# Patient Record
Sex: Male | Born: 1975
Health system: Southern US, Community
[De-identification: ages and names within clinical notes are randomized; demographics above are authoritative.]

## PROBLEM LIST (undated history)

## (undated) DIAGNOSIS — Z9114 Patient's other noncompliance with medication regimen: Secondary | ICD-10-CM

## (undated) DIAGNOSIS — G43909 Migraine, unspecified, not intractable, without status migrainosus: Secondary | ICD-10-CM

## (undated) DIAGNOSIS — R569 Unspecified convulsions: Secondary | ICD-10-CM

## (undated) DIAGNOSIS — Z91148 Patient's other noncompliance with medication regimen for other reason: Secondary | ICD-10-CM

## (undated) DIAGNOSIS — N189 Chronic kidney disease, unspecified: Secondary | ICD-10-CM

## (undated) DIAGNOSIS — I10 Essential (primary) hypertension: Secondary | ICD-10-CM

## (undated) DIAGNOSIS — E781 Pure hyperglyceridemia: Secondary | ICD-10-CM

## (undated) DIAGNOSIS — M419 Scoliosis, unspecified: Secondary | ICD-10-CM

## (undated) HISTORY — PX: KIDNEY DONATION: SHX685

## (undated) HISTORY — DX: Migraine, unspecified, not intractable, without status migrainosus: G43.909

## (undated) HISTORY — DX: Pure hyperglyceridemia: E78.1

## (undated) HISTORY — DX: Chronic kidney disease, unspecified: N18.9

## (undated) HISTORY — DX: Scoliosis, unspecified: M41.9

## (undated) HISTORY — PX: BRAIN SURGERY: SHX531

## (undated) HISTORY — PX: EYE SURGERY: SHX253

## (undated) HISTORY — PX: ORTHOPEDIC SURGERY: SHX850

---

## 2000-07-08 ENCOUNTER — Encounter: Payer: Self-pay | Admitting: Emergency Medicine

## 2000-07-08 ENCOUNTER — Emergency Department (HOSPITAL_COMMUNITY): Admission: EM | Admit: 2000-07-08 | Discharge: 2000-07-08 | Payer: Self-pay

## 2004-10-11 ENCOUNTER — Emergency Department (HOSPITAL_COMMUNITY): Admission: EM | Admit: 2004-10-11 | Discharge: 2004-10-11 | Payer: Self-pay | Admitting: Emergency Medicine

## 2004-12-17 ENCOUNTER — Emergency Department (HOSPITAL_COMMUNITY): Admission: EM | Admit: 2004-12-17 | Discharge: 2004-12-18 | Payer: Self-pay | Admitting: *Deleted

## 2005-11-21 ENCOUNTER — Emergency Department (HOSPITAL_COMMUNITY): Admission: EM | Admit: 2005-11-21 | Discharge: 2005-11-21 | Payer: Self-pay | Admitting: Emergency Medicine

## 2005-12-04 ENCOUNTER — Emergency Department (HOSPITAL_COMMUNITY): Admission: EM | Admit: 2005-12-04 | Discharge: 2005-12-04 | Payer: Self-pay | Admitting: Emergency Medicine

## 2005-12-12 ENCOUNTER — Emergency Department (HOSPITAL_COMMUNITY): Admission: EM | Admit: 2005-12-12 | Discharge: 2005-12-12 | Payer: Self-pay | Admitting: Emergency Medicine

## 2005-12-13 ENCOUNTER — Ambulatory Visit: Payer: Self-pay | Admitting: Psychiatry

## 2005-12-13 ENCOUNTER — Inpatient Hospital Stay (HOSPITAL_COMMUNITY): Admission: EM | Admit: 2005-12-13 | Discharge: 2005-12-17 | Payer: Self-pay | Admitting: Psychiatry

## 2006-01-01 ENCOUNTER — Ambulatory Visit (HOSPITAL_COMMUNITY): Payer: Self-pay | Admitting: Psychology

## 2009-09-09 ENCOUNTER — Emergency Department (HOSPITAL_COMMUNITY): Admission: EM | Admit: 2009-09-09 | Discharge: 2009-09-09 | Payer: Self-pay | Admitting: Emergency Medicine

## 2010-05-03 ENCOUNTER — Emergency Department (HOSPITAL_COMMUNITY)
Admission: EM | Admit: 2010-05-03 | Discharge: 2010-05-03 | Payer: Self-pay | Source: Home / Self Care | Admitting: Emergency Medicine

## 2010-08-15 LAB — BASIC METABOLIC PANEL
Chloride: 98 mEq/L (ref 96–112)
Creatinine, Ser: 1.34 mg/dL (ref 0.4–1.5)
GFR calc Af Amer: >60 mL/min (ref >60)
Glucose, Bld: 122 mg/dL — ABNORMAL HIGH (ref 70–99)

## 2010-08-15 LAB — CBC
HCT: 46 % (ref 39.0–52.0)
Hemoglobin: 17.2 g/dL — ABNORMAL HIGH (ref 13.0–17.0)
MCH: 35.4 pg — ABNORMAL HIGH (ref 26.0–34.0)
MCHC: 37.4 g/dL — ABNORMAL HIGH (ref 30.0–36.0)
Platelets: 204 10*3/uL (ref 150–400)
RBC: 4.86 MIL/uL (ref 4.22–5.81)

## 2010-08-15 LAB — DIFFERENTIAL
Basophils Relative: 0 % (ref 0–1)
Eosinophils Absolute: 0 10*3/uL (ref 0.0–0.7)
Eosinophils Relative: 0 % (ref 0–5)
Lymphocytes Relative: 5 % — ABNORMAL LOW (ref 12–46)
Monocytes Relative: 6 % (ref 3–12)
Neutro Abs: 19.8 10*3/uL — ABNORMAL HIGH (ref 1.7–7.7)
Neutrophils Relative %: 89 % — ABNORMAL HIGH (ref 43–77)

## 2010-08-15 LAB — PHENYTOIN LEVEL, TOTAL: Phenytoin Lvl: 2.6 ug/mL — ABNORMAL LOW (ref 10.0–20.0)

## 2010-08-23 LAB — BASIC METABOLIC PANEL
GFR calc non Af Amer: 58 mL/min — ABNORMAL LOW (ref >60)
Glucose, Bld: 128 mg/dL — ABNORMAL HIGH (ref 70–99)

## 2010-08-23 LAB — CBC
Hemoglobin: 15.6 g/dL (ref 13.0–17.0)
MCHC: 35.8 g/dL (ref 30.0–36.0)
MCV: 98 fL (ref 78.0–100.0)
RBC: 4.47 MIL/uL (ref 4.22–5.81)

## 2010-08-23 LAB — DIFFERENTIAL
Basophils Relative: 1 % (ref 0–1)
Eosinophils Absolute: 0.1 10*3/uL (ref 0.0–0.7)
Eosinophils Relative: 1 % (ref 0–5)
Lymphocytes Relative: 23 % (ref 12–46)
Monocytes Relative: 6 % (ref 3–12)
Neutro Abs: 5.1 10*3/uL (ref 1.7–7.7)
Neutrophils Relative %: 70 % (ref 43–77)

## 2010-08-23 LAB — PHENYTOIN LEVEL, TOTAL: Phenytoin Lvl: <2.5 ug/mL — ABNORMAL LOW (ref 10.0–20.0)

## 2010-09-01 ENCOUNTER — Emergency Department (HOSPITAL_COMMUNITY)
Admission: EM | Admit: 2010-09-01 | Discharge: 2010-09-01 | Disposition: A | Discharge status: Home / Self Care | Payer: Self-pay | Attending: Emergency Medicine | Admitting: Emergency Medicine

## 2010-09-01 DIAGNOSIS — R569 Unspecified convulsions: Secondary | ICD-10-CM | POA: Insufficient documentation

## 2010-09-01 DIAGNOSIS — G40909 Epilepsy, unspecified, not intractable, without status epilepticus: Secondary | ICD-10-CM | POA: Insufficient documentation

## 2010-10-20 NOTE — Discharge Summary (Signed)
Alexander Collier, Alexander Collier                 ACCOUNT NO.:  [id number]   MEDICAL RECORD NO.:  [id number]          PATIENT TYPE:  IPS   LOCATION:  0303                          FACILITY:  [facility]   PHYSICIAN:  Geoffery Lyons, M.D.      DATE OF BIRTH:  [birth year]   DATE OF ADMISSION:  12/13/2005  DATE OF DISCHARGE:  12/17/2005                                 DISCHARGE SUMMARY   CHIEF COMPLAINT AND PRESENT ILLNESS:  This was the first admission to [facility] for this 35 year old white male, married,  involuntarily committed.  Petitioned by the ED.  Overdosed on an unknown  quantity of Valium.  Presented groggy and sedated.  Grandfather found him.  Reported suicidal ideation.  Family concerned about safety.   PAST PSYCHIATRIC HISTORY:  Denies prior admission.  Drinking alcohol  regularly.  Unclear amount.   ALCOHOL/DRUG HISTORY:  As already stated, persistent use of alcohol.   MEDICAL PROBLEMS:  Seizures.   MEDICATIONS:  Dilantin 200 mg three times a day, Valium 10 mg as needed.   PHYSICAL EXAMINATION:  Performed and failed to show any acute findings.   LABORATORY DATA:  TSH 4.013.  Blood chemistry with sodium 140, potassium  3.4, glucose 90, BUN 4, creatinine 1.0.  CBC with white blood cells 8.5,  hemoglobin 17.2.   MENTAL STATUS EXAM:  Mildly sedated, slurred speech, motor slowing, slow  response.  Speech relevant, decreased production, consistent to interview.  Mood irritable and depressed.  Affect irritable.  Thought processes logical,  coherent and relevant.  No delusions.  No suicidal or homicidal ideation.  No hallucinations.  Cognition was well-preserved.   ADMISSION DIAGNOSES:  AXIS I:  Depressive disorder not otherwise specified.  Alcohol abuse; rule out dependence.  AXIS II:  No diagnosis.  AXIS III:  Seizure disorder, status post brain surgery.  AXIS IV:  Moderate.  AXIS V:  GAF upon admission 35; highest GAF in the last year 70.   HOSPITAL COURSE:  He was  admitted.  He was started in individual and group  psychotherapy.  We detoxified with Librium.  He was maintained on his  Dilantin.  He endorsed that he got intoxicated after he became very upset  after wife told him that he was going to leave him.  He reported an overdose  on his Valium and then he threatened to shoot himself with a gun.  He  denied.  [place] [employer] he had a stable job of 11 years.  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  Endorsed that he had three daughters, 16 years married, wife  cannot say why she is wanting to leave him, upset but minimizes, denies  everything that is going on, wanting to leave the hospital.  On December 14, 2005, he was more open about the situation.  Admits to being depressed.  Admits to feeling down.  Started dealing with the possible loss of the  relationship with his wife.  Mother was supportive.  Sister was supportive.  Endorsed he is close to his great-uncle and his great-uncle has told him  about the consequence of killing himself as well as what it would do to his  family.  There was some irritability but eventually he was able to settle  down.  His mood became more euthymic.  There was a session with his mother,  sister and brother.  They were supportive.  By December 16, 2005, he was turning  the corner.  His mood improved.  His affect became brighter.  On December 17, 2005, he was in full contact with reality.  There were no suicidal or  homicidal ideation.  No hallucinations.  No delusions.  Endorsed he was  feeling much better, insightful.  Recognizes he had to come to the unit.  Committed to abstain as indeed he felt that alcohol was making things worse  for him.  As he was in full contact with reality, much improved, we went  ahead and discharged to outpatient follow-up.   DISCHARGE DIAGNOSES:  AXIS I:  Depressive disorder not otherwise specified.  Alcohol abuse.  AXIS II:  No diagnosis.  AXIS III:  Seizure disorder, status post brain surgery.  AXIS IV:   Moderate.  AXIS V:  GAF upon discharge 55-60.   DISCHARGE MEDICATIONS:  1. Phenytek 200 mg, 3 tabs at bedtime.  2. Librium 25 mg, 1 at bedtime as needed for the next seven days; then      discontinue.   [facility] [place] and Dr. Kieth Brightly.      Geoffery Lyons, M.D.  Electronically Signed     IL/MEDQ  D:  12/28/2005  T:  12/29/2005  Job:  [id number]

## 2011-12-12 ENCOUNTER — Encounter (HOSPITAL_COMMUNITY): Payer: Self-pay | Admitting: *Deleted

## 2011-12-12 ENCOUNTER — Encounter (HOSPITAL_COMMUNITY)
Admission: EM | Disposition: A | Discharge status: Home / Self Care | Payer: Self-pay | Source: Home / Self Care | Attending: Emergency Medicine

## 2011-12-12 ENCOUNTER — Emergency Department (HOSPITAL_COMMUNITY): Payer: Medicaid Other | Admitting: Anesthesiology

## 2011-12-12 ENCOUNTER — Emergency Department (HOSPITAL_COMMUNITY): Payer: Medicaid Other

## 2011-12-12 ENCOUNTER — Encounter (HOSPITAL_COMMUNITY): Payer: Self-pay | Admitting: Anesthesiology

## 2011-12-12 ENCOUNTER — Observation Stay (HOSPITAL_COMMUNITY)
Admission: EM | Admit: 2011-12-12 | Discharge: 2011-12-13 | Disposition: A | Discharge status: Home / Self Care | Payer: Medicaid Other | Attending: General Surgery | Admitting: General Surgery

## 2011-12-12 DIAGNOSIS — K3580 Unspecified acute appendicitis: Principal | ICD-10-CM | POA: Insufficient documentation

## 2011-12-12 DIAGNOSIS — K37 Unspecified appendicitis: Secondary | ICD-10-CM

## 2011-12-12 DIAGNOSIS — I10 Essential (primary) hypertension: Secondary | ICD-10-CM | POA: Insufficient documentation

## 2011-12-12 HISTORY — DX: Essential (primary) hypertension: I10

## 2011-12-12 HISTORY — DX: Unspecified convulsions: R56.9

## 2011-12-12 HISTORY — PX: LAPAROSCOPIC APPENDECTOMY: SHX408

## 2011-12-12 LAB — COMPREHENSIVE METABOLIC PANEL
ALT: 19 U/L (ref 0–53)
Albumin: 4.4 g/dL (ref 3.5–5.2)
Calcium: 9.6 mg/dL (ref 8.4–10.5)
GFR calc Af Amer: 83 mL/min — ABNORMAL LOW (ref >90)
GFR calc non Af Amer: 72 mL/min — ABNORMAL LOW (ref >90)
Total Bilirubin: 0.4 mg/dL (ref 0.3–1.2)
Total Protein: 7.9 g/dL (ref 6.0–8.3)

## 2011-12-12 LAB — CBC WITH DIFFERENTIAL/PLATELET
Basophils Absolute: 0 10*3/uL (ref 0.0–0.1)
HCT: 43.7 % (ref 39.0–52.0)
Lymphocytes Relative: 7 % — ABNORMAL LOW (ref 12–46)
Lymphs Abs: 1.2 10*3/uL (ref 0.7–4.0)
MCH: 35.2 pg — ABNORMAL HIGH (ref 26.0–34.0)
MCHC: 37.1 g/dL — ABNORMAL HIGH (ref 30.0–36.0)
Monocytes Absolute: 1.4 10*3/uL — ABNORMAL HIGH (ref 0.1–1.0)
Monocytes Relative: 8 % (ref 3–12)
Platelets: 180 10*3/uL (ref 150–400)

## 2011-12-12 LAB — URINALYSIS, ROUTINE W REFLEX MICROSCOPIC
Leukocytes, UA: NEGATIVE
Protein, ur: NEGATIVE mg/dL
Specific Gravity, Urine: 1.02 (ref 1.005–1.030)
pH: 6 (ref 5.0–8.0)

## 2011-12-12 SURGERY — APPENDECTOMY, LAPAROSCOPIC
Anesthesia: General | Site: Abdomen | Wound class: Contaminated

## 2011-12-12 MED ORDER — PROPOFOL 10 MG/ML IV BOLUS
INTRAVENOUS | Status: DC | PRN
  Administered 2011-12-12: 150 mg via INTRAVENOUS

## 2011-12-12 MED ORDER — SODIUM CHLORIDE 0.9 % IV SOLN: INTRAVENOUS | Status: DC

## 2011-12-12 MED ORDER — ONDANSETRON HCL 4 MG/2ML IJ SOLN: 4.0000 mg | Freq: Once | INTRAMUSCULAR | Status: DC | PRN

## 2011-12-12 MED ORDER — BUPIVACAINE HCL (PF) 0.5 % IJ SOLN
INTRAMUSCULAR | Status: AC
  Filled 2011-12-12: qty 30

## 2011-12-12 MED ORDER — SODIUM CHLORIDE 0.9 % IV SOLN
1.0000 g | INTRAVENOUS | Status: AC
  Administered 2011-12-12: 1 g via INTRAVENOUS

## 2011-12-12 MED ORDER — BUPIVACAINE HCL (PF) 0.5 % IJ SOLN
INTRAMUSCULAR | Status: DC | PRN
  Administered 2011-12-12: 10 mL

## 2011-12-12 MED ORDER — HYDROMORPHONE HCL PF 1 MG/ML IJ SOLN
1.0000 mg | Freq: Once | INTRAMUSCULAR | Status: AC
  Administered 2011-12-12: 1 mg via INTRAVENOUS
  Filled 2011-12-12: qty 1

## 2011-12-12 MED ORDER — LACTATED RINGERS IV SOLN
INTRAVENOUS | Status: DC
  Administered 2011-12-12 (×2): 1000 mL via INTRAVENOUS

## 2011-12-12 MED ORDER — ACETAMINOPHEN 10 MG/ML IV SOLN
INTRAVENOUS | Status: AC
  Filled 2011-12-12: qty 200

## 2011-12-12 MED ORDER — LACTATED RINGERS IV SOLN
INTRAVENOUS | Status: DC
  Administered 2011-12-12: 20:00:00 via INTRAVENOUS

## 2011-12-12 MED ORDER — ACETAMINOPHEN 10 MG/ML IV SOLN
INTRAVENOUS | Status: AC
  Filled 2011-12-12: qty 100

## 2011-12-12 MED ORDER — GLYCOPYRROLATE 0.2 MG/ML IJ SOLN
INTRAMUSCULAR | Status: AC
  Filled 2011-12-12: qty 3

## 2011-12-12 MED ORDER — PHENYTOIN SODIUM EXTENDED 100 MG PO CAPS
200.0000 mg | ORAL_CAPSULE | Freq: Every day | ORAL | Status: DC
  Administered 2011-12-13: 200 mg via ORAL
  Filled 2011-12-12: qty 2

## 2011-12-12 MED ORDER — ONDANSETRON HCL 4 MG/2ML IJ SOLN
4.0000 mg | Freq: Once | INTRAMUSCULAR | Status: AC
  Administered 2011-12-12: 4 mg via INTRAVENOUS
  Filled 2011-12-12: qty 2

## 2011-12-12 MED ORDER — ONDANSETRON HCL 4 MG/2ML IJ SOLN
4.0000 mg | Freq: Once | INTRAMUSCULAR | Status: AC
  Administered 2011-12-12: 4 mg via INTRAVENOUS

## 2011-12-12 MED ORDER — PANTOPRAZOLE SODIUM 40 MG PO TBEC
40.0000 mg | DELAYED_RELEASE_TABLET | Freq: Every day | ORAL | Status: DC
  Administered 2011-12-12: 40 mg via ORAL
  Filled 2011-12-12: qty 1

## 2011-12-12 MED ORDER — FENTANYL CITRATE 0.05 MG/ML IJ SOLN: 25.0000 ug | INTRAMUSCULAR | Status: DC | PRN

## 2011-12-12 MED ORDER — MIDAZOLAM HCL 2 MG/2ML IJ SOLN
INTRAMUSCULAR | Status: AC
  Administered 2011-12-12: 2 mg via INTRAVENOUS
  Filled 2011-12-12: qty 2

## 2011-12-12 MED ORDER — FENTANYL CITRATE 0.05 MG/ML IJ SOLN
INTRAMUSCULAR | Status: AC
  Filled 2011-12-12: qty 5

## 2011-12-12 MED ORDER — LIDOCAINE HCL 1 % IJ SOLN
INTRAMUSCULAR | Status: DC | PRN
  Administered 2011-12-12: 40 mg via INTRADERMAL

## 2011-12-12 MED ORDER — HEMOSTATIC AGENTS (NO CHARGE) OPTIME
TOPICAL | Status: DC | PRN
  Administered 2011-12-12: 1 via TOPICAL

## 2011-12-12 MED ORDER — MIDAZOLAM HCL 2 MG/2ML IJ SOLN
1.0000 mg | INTRAMUSCULAR | Status: DC | PRN
  Administered 2011-12-12: 2 mg via INTRAVENOUS

## 2011-12-12 MED ORDER — SODIUM CHLORIDE 0.9 % IR SOLN
Status: DC | PRN
  Administered 2011-12-12: 1000 mL

## 2011-12-12 MED ORDER — PHENYTOIN SODIUM EXTENDED 100 MG PO CAPS
300.0000 mg | ORAL_CAPSULE | Freq: Every day | ORAL | Status: DC
  Administered 2011-12-12: 300 mg via ORAL
  Filled 2011-12-12: qty 3

## 2011-12-12 MED ORDER — FENTANYL CITRATE 0.05 MG/ML IJ SOLN
INTRAMUSCULAR | Status: DC | PRN
  Administered 2011-12-12 (×5): 50 ug via INTRAVENOUS

## 2011-12-12 MED ORDER — ROCURONIUM BROMIDE 50 MG/5ML IV SOLN
INTRAVENOUS | Status: AC
  Filled 2011-12-12: qty 1

## 2011-12-12 MED ORDER — ENOXAPARIN SODIUM 40 MG/0.4ML ~~LOC~~ SOLN: 40.0000 mg | SUBCUTANEOUS | Status: DC

## 2011-12-12 MED ORDER — ENOXAPARIN SODIUM 40 MG/0.4ML ~~LOC~~ SOLN
40.0000 mg | SUBCUTANEOUS | Status: DC
  Administered 2011-12-13: 40 mg via SUBCUTANEOUS
  Filled 2011-12-12: qty 0.4

## 2011-12-12 MED ORDER — PROPRANOLOL HCL 20 MG PO TABS
20.0000 mg | ORAL_TABLET | Freq: Two times a day (BID) | ORAL | Status: DC
  Administered 2011-12-12 – 2011-12-13 (×2): 20 mg via ORAL
  Filled 2011-12-12 (×2): qty 1

## 2011-12-12 MED ORDER — ACETAMINOPHEN 10 MG/ML IV SOLN
1000.0000 mg | Freq: Four times a day (QID) | INTRAVENOUS | Status: DC
  Administered 2011-12-12 – 2011-12-13 (×2): 1000 mg via INTRAVENOUS
  Filled 2011-12-12 (×4): qty 100

## 2011-12-12 MED ORDER — ONDANSETRON HCL 4 MG/2ML IJ SOLN
INTRAMUSCULAR | Status: AC
  Administered 2011-12-12: 4 mg via INTRAVENOUS
  Filled 2011-12-12: qty 2

## 2011-12-12 MED ORDER — HYDROMORPHONE HCL PF 1 MG/ML IJ SOLN
1.0000 mg | INTRAMUSCULAR | Status: DC | PRN
  Filled 2011-12-12: qty 1

## 2011-12-12 MED ORDER — ONDANSETRON HCL 4 MG/2ML IJ SOLN: 4.0000 mg | Freq: Four times a day (QID) | INTRAMUSCULAR | Status: DC | PRN

## 2011-12-12 MED ORDER — NEOSTIGMINE METHYLSULFATE 1 MG/ML IJ SOLN
INTRAMUSCULAR | Status: DC | PRN
  Administered 2011-12-12: 3 mg via INTRAVENOUS

## 2011-12-12 MED ORDER — ROCURONIUM BROMIDE 100 MG/10ML IV SOLN
INTRAVENOUS | Status: DC | PRN
  Administered 2011-12-12: 10 mg via INTRAVENOUS
  Administered 2011-12-12: 30 mg via INTRAVENOUS

## 2011-12-12 MED ORDER — SODIUM CHLORIDE 0.9 % IV SOLN
INTRAVENOUS | Status: AC
  Filled 2011-12-12: qty 1

## 2011-12-12 MED ORDER — ONDANSETRON HCL 4 MG PO TABS: 4.0000 mg | ORAL_TABLET | Freq: Four times a day (QID) | ORAL | Status: DC | PRN

## 2011-12-12 SURGICAL SUPPLY — 45 items
BAG HAMPER (MISCELLANEOUS) ×2 IMPLANT
CLOTH BEACON ORANGE TIMEOUT ST (SAFETY) ×2 IMPLANT
COVER LIGHT HANDLE STERIS (MISCELLANEOUS) ×4 IMPLANT
CUTTER ENDO LINEAR 45M (STAPLE) IMPLANT
CUTTER LINEAR ENDO 35 ETS (STAPLE) IMPLANT
CUTTER LINEAR ENDO 35 ETS TH (STAPLE) ×2 IMPLANT
DECANTER SPIKE VIAL GLASS SM (MISCELLANEOUS) ×2 IMPLANT
DISSECTOR BLUNT TIP ENDO 5MM (MISCELLANEOUS) IMPLANT
DURAPREP 26ML APPLICATOR (WOUND CARE) ×2 IMPLANT
ELECT REM PT RETURN 9FT ADLT (ELECTROSURGICAL) ×2
ELECTRODE REM PT RTRN 9FT ADLT (ELECTROSURGICAL) ×1 IMPLANT
FILTER SMOKE EVAC LAPAROSHD (FILTER) ×2 IMPLANT
FORMALIN 10 PREFIL 120ML (MISCELLANEOUS) ×2 IMPLANT
GLOVE BIO SURGEON STRL SZ7.5 (GLOVE) ×2 IMPLANT
GLOVE BIOGEL PI IND STRL 7.0 (GLOVE) ×1 IMPLANT
GLOVE BIOGEL PI INDICATOR 7.0 (GLOVE) ×1
GLOVE SKINSENSE NS SZ6.5 (GLOVE) ×1
GLOVE SKINSENSE STRL SZ6.5 (GLOVE) ×1 IMPLANT
GOWN STRL REIN XL XLG (GOWN DISPOSABLE) ×4 IMPLANT
HEMOSTAT SURGICEL 4X8 (HEMOSTASIS) ×2 IMPLANT
INST SET LAPROSCOPIC AP (KITS) ×2 IMPLANT
IV NS IRRIG 3000ML ARTHROMATIC (IV SOLUTION) IMPLANT
KIT ROOM TURNOVER APOR (KITS) ×2 IMPLANT
MANIFOLD NEPTUNE II (INSTRUMENTS) ×2 IMPLANT
NEEDLE INSUFFLATION 14GA 120MM (NEEDLE) ×2 IMPLANT
NS IRRIG 1000ML POUR BTL (IV SOLUTION) ×2 IMPLANT
PACK LAP CHOLE LZT030E (CUSTOM PROCEDURE TRAY) ×2 IMPLANT
PAD ARMBOARD 7.5X6 YLW CONV (MISCELLANEOUS) ×2 IMPLANT
PENCIL HANDSWITCHING (ELECTRODE) ×2 IMPLANT
POUCH SPECIMEN RETRIEVAL 10MM (ENDOMECHANICALS) ×2 IMPLANT
RELOAD /EVU35 (ENDOMECHANICALS) IMPLANT
RELOAD 45 VASCULAR/THIN (ENDOMECHANICALS) IMPLANT
RELOAD CUTTER ETS 35MM STAND (ENDOMECHANICALS) IMPLANT
SCALPEL HARMONIC ACE (MISCELLANEOUS) ×2 IMPLANT
SET BASIN LINEN APH (SET/KITS/TRAYS/PACK) ×2 IMPLANT
SET TUBE IRRIG SUCTION NO TIP (IRRIGATION / IRRIGATOR) IMPLANT
SPONGE GAUZE 2X2 8PLY STRL LF (GAUZE/BANDAGES/DRESSINGS) ×6 IMPLANT
STAPLER VISISTAT (STAPLE) ×2 IMPLANT
SUT VICRYL 0 UR6 27IN ABS (SUTURE) ×2 IMPLANT
TRAY FOLEY CATH 14FR (SET/KITS/TRAYS/PACK) ×2 IMPLANT
TROCAR Z-THAD FIOS HNDL 12X100 (TROCAR) ×2 IMPLANT
TROCAR Z-THRD FIOS HNDL 11X100 (TROCAR) ×2 IMPLANT
TROCAR Z-THREAD FIOS 5X100MM (TROCAR) ×2 IMPLANT
WARMER LAPAROSCOPE (MISCELLANEOUS) ×2 IMPLANT
YANKAUER SUCT BULB TIP 10FT TU (MISCELLANEOUS) ×2 IMPLANT

## 2011-12-12 NOTE — Op Note (Signed)
Patient:  Alexander Collier  DOB:  01-02-1976  MRN:  [id number]   Preop Diagnosis:  Acute appendicitis  Postop Diagnosis:  Same  Procedure:  Laparoscopic appendectomy  Surgeon:  Franky Macho, M.D.  Anes:  General endotracheal  Indications:  Patient is a 36 year old white male who presents with a less than 24-hour history of right lower quadrant abdominal pain. CT scan of the abdomen revealed acute appendicitis area patient now comes the operating room for laparoscopic appendectomy. The risks and benefits of the procedure including bleeding, infection, the possibly of an open procedure were fully explained to the patient, gave informed consent.  Procedure note:  Patient is placed the supine position. After induction of general endotracheal anesthesia, the abdomen was prepped and draped using usual sterile technique with DuraPrep. Surgical site confirmation was performed.  An infraumbilical incision was made down to the fascia. A Veress needle was introduced into the abdominal cavity and confirmation of placement was done using the saline drop test. The abdomen was then insufflated to 16 mm mercury pressure. An 11 mm trocar was introduced into the abdominal cavity under direct visualization without difficulty. The patient was placed in deeper Trendelenburg position and additional 12 mm trocar was placed the suprapubic region and a 5 mm trocar was placed left lower quadrant region. The appendix was visualized in its distal half was noted to be inflamed. The mesial appendix was divided using the harmonic scalpel. A vascular Endo GIA was placed across the base the appendix and fired. The staple line was inspected and noted within normal limits. The appendix was removed using an Endo Catch bag without difficulty. Surgicel is placed over the staple line and the appendiceal mesentery. All fluid and air were then evacuated from the abdominal cavity prior to removal of the trochars.  All wounds were gave  normal saline. All wounds were injected with 0.5% Sensorcaine. The infraumbilical fashion as well as suprapubic fascia were reapproximated using 0 Vicryl interrupted sutures. All skin incisions were closed using staples. Betadine ointment and dressed a dressings were applied.  All tape and needle counts were correct at the end of the procedure. Patient was extubated in the operating room and went back recovery room awake in stable condition.  Complications:  None  EBL:  Minimal  Specimen:  Appendix

## 2011-12-12 NOTE — ED Notes (Signed)
Pt states severe RLQ pain began at 0700. Denies N/V. Pt required a stretcher to come in from front due to pain

## 2011-12-12 NOTE — Anesthesia Preprocedure Evaluation (Signed)
Anesthesia Evaluation  Patient identified by MRN, date of birth, ID band Patient awake    Reviewed: Allergy & Precautions, H&P , NPO status , Patient's Chart, lab work & pertinent test results, reviewed documented beta blocker date and time   History of Anesthesia Complications Negative for: history of anesthetic complications  Airway Mallampati: II TM Distance: >3 FB     Dental  (+) Teeth Intact   Pulmonary neg pulmonary ROS,  breath sounds clear to auscultation        Cardiovascular hypertension, Pt. on medications Rhythm:Regular     Neuro/Psych Seizures - (2 months ago), Well Controlled,  Craniotomy for unknown reason    GI/Hepatic GERD-  ,  Endo/Other    Renal/GU      Musculoskeletal   Abdominal   Peds  Hematology   Anesthesia Other Findings   Reproductive/Obstetrics                           Anesthesia Physical Anesthesia Plan  ASA: II  Anesthesia Plan: General   Post-op Pain Management:    Induction: Intravenous, Rapid sequence and Cricoid pressure planned  Airway Management Planned: Oral ETT  Additional Equipment:   Intra-op Plan:   Post-operative Plan: Extubation in OR  Informed Consent: I have reviewed the patients History and Physical, chart, labs and discussed the procedure including the risks, benefits and alternatives for the proposed anesthesia with the patient or authorized representative who has indicated his/her understanding and acceptance.     Plan Discussed with:   Anesthesia Plan Comments:         Anesthesia Quick Evaluation

## 2011-12-12 NOTE — Anesthesia Postprocedure Evaluation (Signed)
  Anesthesia Post-op Note  Patient: Alexander Collier  Procedure(s) Performed: Procedure(s) (LRB): APPENDECTOMY LAPAROSCOPIC (N/A)  Patient Location: PACU  Anesthesia Type: General  Level of Consciousness: awake, alert  and oriented  Airway and Oxygen Therapy: Patient Spontanous Breathing and Patient connected to face mask oxygen  Post-op Pain: mild  Post-op Assessment: Post-op Vital signs reviewed, Patient's Cardiovascular Status Stable, Respiratory Function Stable, Patent Airway and No signs of Nausea or vomiting  Post-op Vital Signs: Reviewed and stable  Complications: No apparent anesthesia complications

## 2011-12-12 NOTE — ED Notes (Signed)
EDP has been in to update, aware surgery will be today.

## 2011-12-12 NOTE — Transfer of Care (Signed)
Immediate Anesthesia Transfer of Care Note  Patient: Alexander Collier  Procedure(s) Performed: Procedure(s) (LRB): APPENDECTOMY LAPAROSCOPIC (N/A)  Patient Location: PACU  Anesthesia Type: General  Level of Consciousness: awake, alert  and oriented  Airway & Oxygen Therapy: Patient Spontanous Breathing and Patient connected to face mask oxygen  Post-op Assessment: Report given to PACU RN  Post vital signs: Reviewed and stable  Complications: No apparent anesthesia complications

## 2011-12-12 NOTE — ED Notes (Signed)
edp just in

## 2011-12-12 NOTE — H&P (Signed)
Alexander Collier is an 36 y.o. male.   Chief Complaint: Lower abdominal pain HPI: Patient is a 35 year old white male who earlier today began experiencing lower abdominal pain. It seemed to be located in the right lower quadrant. A CT scan of the abdomen and pelvis was done the emergency room which reveals acute appendicitis without evidence of perforation.  Past Medical History  Diagnosis Date  . Seizures     Epilepsy  . Hypertension     Past Surgical History  Procedure Date  . Brain surgery   . Orthopedic surgery     No family history on file. Social History:  reports that he has never smoked. He does not have any smokeless tobacco history on file. He reports that he does not drink alcohol or use illicit drugs.  Allergies: No Known Allergies  Medications Prior to Admission  Medication Sig Dispense Refill  . phenytoin (DILANTIN) 100 MG ER capsule Take 200-300 mg by mouth 2 (two) times daily. 2 capsules am, 3 capsule pm      . propranolol (INDERAL) 20 MG tablet Take 20 mg by mouth 2 (two) times daily.      . ranitidine (ZANTAC) 300 MG capsule Take 300 mg by mouth every evening.        Results for orders placed during the hospital encounter of 12/12/11 (from the past 48 hour(s))  COMPREHENSIVE METABOLIC PANEL     Status: Abnormal   Collection Time   12/12/11 12:11 PM      Component Value Range Comment   Sodium 138  135 - 145 mEq/L    Potassium 3.1 (*) 3.5 - 5.1 mEq/L    Chloride 99  96 - 112 mEq/L    CO2 28  19 - 32 mEq/L    Glucose, Bld 107 (*) 70 - 99 mg/dL    BUN 10  6 - 23 mg/dL    Creatinine, Ser 1.61  0.50 - 1.35 mg/dL    Calcium 9.6  8.4 - 09.6 mg/dL    Total Protein 7.9  6.0 - 8.3 g/dL    Albumin 4.4  3.5 - 5.2 g/dL    AST 23  0 - 37 U/L    ALT 19  0 - 53 U/L    Alkaline Phosphatase 127 (*) 39 - 117 U/L    Total Bilirubin 0.4  0.3 - 1.2 mg/dL    GFR calc non Af Amer 72 (*) >90 mL/min    GFR calc Af Amer 83 (*) >90 mL/min   CBC WITH DIFFERENTIAL     Status:  Abnormal   Collection Time   12/12/11 12:11 PM      Component Value Range Comment   WBC 17.9 (*) 4.0 - 10.5 K/uL    RBC 4.60  4.22 - 5.81 MIL/uL    Hemoglobin 16.2  13.0 - 17.0 g/dL    HCT 04.5  40.9 - 81.1 %    MCV 95.0  78.0 - 100.0 fL    MCH 35.2 (*) 26.0 - 34.0 pg    MCHC 37.1 (*) 30.0 - 36.0 g/dL    RDW 91.4  78.2 - 95.6 %    Platelets 180  150 - 400 K/uL    Neutrophils Relative 86 (*) 43 - 77 %    Neutro Abs 15.3 (*) 1.7 - 7.7 K/uL    Lymphocytes Relative 7 (*) 12 - 46 %    Lymphs Abs 1.2  0.7 - 4.0 K/uL    Monocytes Relative 8  3 - 12 %    Monocytes Absolute 1.4 (*) 0.1 - 1.0 K/uL    Eosinophils Relative 0  0 - 5 %    Eosinophils Absolute 0.0  0.0 - 0.7 K/uL    Basophils Relative 0  0 - 1 %    Basophils Absolute 0.0  0.0 - 0.1 K/uL   URINALYSIS, ROUTINE W REFLEX MICROSCOPIC     Status: Abnormal   Collection Time   12/12/11 12:42 PM      Component Value Range Comment   Color, Urine YELLOW  YELLOW    APPearance CLEAR  CLEAR    Specific Gravity, Urine 1.020  1.005 - 1.030    pH 6.0  5.0 - 8.0    Glucose, UA NEGATIVE  NEGATIVE mg/dL    Hgb urine dipstick SMALL (*) NEGATIVE    Bilirubin Urine NEGATIVE  NEGATIVE    Ketones, ur NEGATIVE  NEGATIVE mg/dL    Protein, ur NEGATIVE  NEGATIVE mg/dL    Urobilinogen, UA 0.2  0.0 - 1.0 mg/dL    Nitrite NEGATIVE  NEGATIVE    Leukocytes, UA NEGATIVE  NEGATIVE   URINE MICROSCOPIC-ADD ON     Status: Normal   Collection Time   12/12/11 12:42 PM      Component Value Range Comment   RBC / HPF 0-2  <3 RBC/hpf    Ct Abdomen Pelvis Wo Contrast  12/12/2011  *RADIOLOGY REPORT*  Clinical Data: Right lower quadrant abdominal pain.  Prior left nephrectomy (renal donor).  CT ABDOMEN AND PELVIS WITHOUT CONTRAST  Technique:  Multidetector CT imaging of the abdomen and pelvis was performed following the standard protocol without intravenous contrast.  Comparison: None.  Findings: The visualized portion of the liver, spleen, pancreas, and adrenal glands  appear unremarkable in noncontrast CT appearance.  The gallbladder and biliary system appear unremarkable.  Right kidney appears unremarkable.  Left kidney surgically absent.  Acute appendicitis is present, with a 1.1 cm appendicolith, appendiceal diameter at 1.3 cm, and periappendiceal stranding.  The appendix is primarily retrocecal in position.  Terminal ileum unremarkable.  Urinary bladder appears normal.  No periappendiceal abscess or extraluminal gas identified.  IMPRESSION:  1.  Acute appendicitis, without evidence of periappendiceal abscess or extraluminal gas. 2.  Prior left nephrectomy. These results were called by telephone on 12/12/2011  at  2:28 p.m. to  Dr. Vanetta Mulders, who verbally acknowledged these results.  Original Report Authenticated By: Dellia Cloud, M.D.    Review of Systems  Constitutional: Positive for malaise/fatigue.  HENT: Negative.   Eyes: Negative.   Respiratory: Negative.   Cardiovascular: Negative.   Gastrointestinal: Positive for nausea and abdominal pain.  Genitourinary: Negative.   Musculoskeletal: Negative.   Skin: Negative.   Neurological: Positive for seizures.  Endo/Heme/Allergies: Negative.     Blood pressure 134/87, pulse 67, temperature 98.2 F (36.8 C), temperature source Oral, resp. rate 18, height 5\' 8"  (1.727 m), weight 72.576 kg (160 lb), SpO2 96.00%. Physical Exam  Constitutional: He is oriented to person, place, and time. He appears well-developed and well-nourished.  HENT:  Head: Normocephalic and atraumatic.  Neck: Normal range of motion. Neck supple.  Cardiovascular: Normal rate, regular rhythm and normal heart sounds.   Respiratory: Effort normal and breath sounds normal.  GI: Soft. There is tenderness. There is rebound.       Tender in right lower quadrant to palpation.  Neurological: He is alert and oriented to person, place, and time.  Skin: Skin is warm.  Assessment/Plan Impression: Acute appendicitis Plan: The  patient will be taken to the operating room urgently for laparoscopic appendectomy. The risks and benefits of the procedure including bleeding, infection, and a possibly of an open procedure were fully explained to the patient, gave informed consent.  Chelsie Burel A 12/12/2011, 3:45 PM

## 2011-12-12 NOTE — ED Provider Notes (Addendum)
History     CSN: [id number]  Arrival date & time 12/12/11  1202   First MD Initiated Contact with Patient 12/12/11 1257      Chief Complaint  Patient presents with  . Abdominal Pain    (Consider location/radiation/quality/duration/timing/severity/associated sxs/prior treatment) Patient is a 36 y.o. male presenting with abdominal pain. The history is provided by the patient.  Abdominal Pain The primary symptoms of the illness include abdominal pain, nausea and vomiting. The primary symptoms of the illness do not include fever, shortness of breath, diarrhea or dysuria. The current episode started 6 to 12 hours ago.  The abdominal pain began 6 to 12 hours ago. The pain came on suddenly. The abdominal pain has been unchanged since its onset. The abdominal pain is located in the RLQ. The abdominal pain radiates to the groin. The severity of the abdominal pain is 8/10. The abdominal pain is relieved by nothing.  Symptoms associated with the illness do not include hematuria or back pain.   Patient with onset of severe right lower quadrant pain radiating towards the right groin at 7:00 this morning felt fine earlier associated with some nausea and vomiting.  Past Medical History  Diagnosis Date  . Seizures     Epilepsy  . Hypertension     Past Surgical History  Procedure Date  . Brain surgery   . Orthopedic surgery     No family history on file.  History  Substance Use Topics  . Smoking status: Never Smoker   . Smokeless tobacco: Not on file  . Alcohol Use: No      Review of Systems  Constitutional: Negative for fever.  HENT: Negative for congestion and neck pain.   Eyes: Negative for redness.  Respiratory: Negative for shortness of breath.   Cardiovascular: Negative for chest pain.  Gastrointestinal: Positive for nausea, vomiting and abdominal pain. Negative for diarrhea.  Genitourinary: Negative for dysuria and hematuria.  Musculoskeletal: Negative for back pain.    Skin: Negative for rash.  Neurological: Negative for headaches.  Hematological: Does not bruise/bleed easily.    Allergies  Review of patient's allergies indicates no known allergies.  Home Medications   Current Outpatient Rx  Name Route Sig Dispense Refill  . PHENYTOIN SODIUM EXTENDED 100 MG PO CAPS Oral Take 200-300 mg by mouth 2 (two) times daily. 2 capsules am, 3 capsule pm    . PROPRANOLOL HCL 20 MG PO TABS Oral Take 20 mg by mouth 2 (two) times daily.    Marland Kitchen RANITIDINE HCL 300 MG PO CAPS Oral Take 300 mg by mouth every evening.      BP 134/87  Pulse 67  Temp 98.2 F (36.8 C) (Oral)  Resp 18  Ht 5\' 8"  (1.727 m)  Wt 160 lb (72.576 kg)  BMI 24.33 kg/m2  SpO2 96%  Physical Exam  Nursing note and vitals reviewed. Constitutional: He is oriented to person, place, and time. He appears well-developed and well-nourished. No distress.  HENT:  Head: Normocephalic and atraumatic.  Mouth/Throat: Oropharynx is clear and moist.  Eyes: Conjunctivae and EOM are normal.  Neck: Normal range of motion. Neck supple.  Cardiovascular: Normal rate, regular rhythm and normal heart sounds.   No murmur heard. Pulmonary/Chest: Effort normal and breath sounds normal. No respiratory distress. He exhibits no tenderness.  Abdominal: Soft. Bowel sounds are normal. There is tenderness. There is guarding.       Tender right lower quadrant.  Musculoskeletal: Normal range of motion. He exhibits no edema.  Neurological: He is alert and oriented to person, place, and time. No cranial nerve deficit. He exhibits normal muscle tone. Coordination normal.  Skin: Skin is warm. No rash noted. No erythema.    ED Course  Procedures (including critical care time)  Labs Reviewed  COMPREHENSIVE METABOLIC PANEL - Abnormal; Notable for the following:    Potassium 3.1 (*)     Glucose, Bld 107 (*)     Alkaline Phosphatase 127 (*)     GFR calc non Af Amer 72 (*)     GFR calc Af Amer 83 (*)     All other  components within normal limits  CBC WITH DIFFERENTIAL - Abnormal; Notable for the following:    WBC 17.9 (*)     MCH 35.2 (*)     MCHC 37.1 (*)     Neutrophils Relative 86 (*)     Neutro Abs 15.3 (*)     Lymphocytes Relative 7 (*)     Monocytes Absolute 1.4 (*)     All other components within normal limits  URINALYSIS, ROUTINE W REFLEX MICROSCOPIC - Abnormal; Notable for the following:    Hgb urine dipstick SMALL (*)     All other components within normal limits  URINE MICROSCOPIC-ADD ON   Ct Abdomen Pelvis Wo Contrast  12/12/2011  *RADIOLOGY REPORT*  Clinical Data: Right lower quadrant abdominal pain.  Prior left nephrectomy (renal donor).  CT ABDOMEN AND PELVIS WITHOUT CONTRAST  Technique:  Multidetector CT imaging of the abdomen and pelvis was performed following the standard protocol without intravenous contrast.  Comparison: None.  Findings: The visualized portion of the liver, spleen, pancreas, and adrenal glands appear unremarkable in noncontrast CT appearance.  The gallbladder and biliary system appear unremarkable.  Right kidney appears unremarkable.  Left kidney surgically absent.  Acute appendicitis is present, with a 1.1 cm appendicolith, appendiceal diameter at 1.3 cm, and periappendiceal stranding.  The appendix is primarily retrocecal in position.  Terminal ileum unremarkable.  Urinary bladder appears normal.  No periappendiceal abscess or extraluminal gas identified.  IMPRESSION:  1.  Acute appendicitis, without evidence of periappendiceal abscess or extraluminal gas. 2.  Prior left nephrectomy. These results were called by telephone on 12/12/2011  at  2:28 p.m. to  Dr. Vanetta Mulders, who verbally acknowledged these results.  Original Report Authenticated By: Dellia Cloud, M.D.     1. Appendicitis       MDM    Discussed with surgery CT is consistent with acute appendicitis. Dr. Lovell Sheehan will take patient to the operating room and will do the meeting  orders.Initially thought that the symptoms may be due to renal colic due to the acute onset of pain is 7 this morning however CT confirms that it is consistent with appendicitis.         Shelda Jakes, MD 12/12/11 1501  Shelda Jakes, MD 12/13/11 1213

## 2011-12-12 NOTE — Anesthesia Procedure Notes (Signed)
Procedure Name: Intubation Date/Time: 12/12/2011 5:10 PM Performed by: Glynn Octave E Pre-anesthesia Checklist: Patient identified, Patient being monitored, Timeout performed, Emergency Drugs available and Suction available Patient Re-evaluated:Patient Re-evaluated prior to inductionOxygen Delivery Method: Circle System Utilized Preoxygenation: Pre-oxygenation with 100% oxygen Intubation Type: IV induction, Rapid sequence and Cricoid Pressure applied Laryngoscope Size: Mac and 3 Grade View: Grade I Tube type: Oral Tube size: 7.0 mm Number of attempts: 1 Airway Equipment and Method: stylet Placement Confirmation: ETT inserted through vocal cords under direct vision,  positive ETCO2 and breath sounds checked- equal and bilateral Secured at: 21 cm Tube secured with: Tape Dental Injury: Teeth and Oropharynx as per pre-operative assessment

## 2011-12-12 NOTE — ED Notes (Signed)
Per OR, bring pt over

## 2011-12-12 NOTE — ED Notes (Signed)
Pt back from ct. Rating pain 5

## 2011-12-13 LAB — CBC
HCT: 38.6 % — ABNORMAL LOW (ref 39.0–52.0)
Hemoglobin: 13.8 g/dL (ref 13.0–17.0)
MCH: 34.4 pg — ABNORMAL HIGH (ref 26.0–34.0)
Platelets: 162 10*3/uL (ref 150–400)
RBC: 4.01 MIL/uL — ABNORMAL LOW (ref 4.22–5.81)
RDW: 13.1 % (ref 11.5–15.5)
WBC: 11.8 10*3/uL — ABNORMAL HIGH (ref 4.0–10.5)

## 2011-12-13 MED ORDER — OXYCODONE-ACETAMINOPHEN 7.5-325 MG PO TABS: 1.0000 | ORAL_TABLET | ORAL | Status: DC | PRN

## 2011-12-13 NOTE — Anesthesia Postprocedure Evaluation (Signed)
Anesthesia Post Note  Patient: Alexander Collier  Procedure(s) Performed: Procedure(s) (LRB): APPENDECTOMY LAPAROSCOPIC (N/A)  Anesthesia type: General  Patient location: 340  Post pain: Pain level controlled  Post assessment: Post-op Vital signs reviewed, Patient's Cardiovascular Status Stable, Respiratory Function Stable, Patent Airway, No signs of Nausea or vomiting and Pain level controlled  Last Vitals:  Filed Vitals:   12/13/11 1012  BP: 116/73  Pulse: 67  Temp: 36.9 C  Resp: 16    Post vital signs: Reviewed and stable  Level of consciousness: awake and alert   Complications: No apparent anesthesia complications

## 2011-12-13 NOTE — Progress Notes (Signed)
UR chart review completed.  

## 2011-12-13 NOTE — Care Management Note (Signed)
    Page 1 of 1   12/13/2011     11:23:50 AM   CARE MANAGEMENT NOTE 12/13/2011  Patient:  Alexander Collier, Alexander Collier   Account Number:  [id number]  Date Initiated:  12/13/2011  Documentation initiated by:  Sharrie Rothman  Subjective/Objective Assessment:   Pt admitted from home s/p lap appy. Pt lives at home with significant other and will return home at discharge. Pt is independent with ADL's.     Action/Plan:   No CM needs. Pt has money to buy prescriptions. Financial counselor is aware of pts self pay status.   Anticipated DC Date:  12/13/2011   Anticipated DC Plan:  HOME/SELF CARE  In-house referral  Financial Counselor      DC Planning Services  CM consult      Choice offered to / List presented to:             Status of service:  Completed, signed off Medicare Important Message given?   (If response is "NO", the following Medicare IM given date fields will be blank) Date Medicare IM given:   Date Additional Medicare IM given:    Discharge Disposition:  HOME/SELF CARE  Per UR Regulation:    If discussed at Long Length of Stay Meetings, dates discussed:    Comments:  12/12/11 1121 Arlyss Queen, RN BSN CM

## 2011-12-13 NOTE — Addendum Note (Signed)
Addendum  created 12/13/11 1119 by Clary Meeker S Micha Erck, CRNA   Modules edited:Notes Section    

## 2011-12-13 NOTE — Progress Notes (Signed)
Discharge instructions given on medications,and follow up visits,patient verbalized understanding.Prescriptions sent with patient.Accompanied by staff to an awaiting vehicle. 

## 2011-12-13 NOTE — Discharge Summary (Signed)
Physician Discharge Summary  Patient ID: Alexander Collier MRN: [id number] DOB/AGE: 1975/08/10 36 y.o.  Admit date: 12/12/2011 Discharge date: 12/13/2011  Admission Diagnoses: Acute appendicitis, history of seizure disorder  Discharge Diagnoses: Same Active Problems:  * No active hospital problems. *    Discharged Condition: good  Hospital Course: Patient is a 36 year old white male who presented emergency room with a less than 24-hour history of right lower quadrant abdominal pain. CT scan the abdomen revealed acute appendicitis. He was taken to the operating room on 12/12/2011 underwent laparoscopic appendectomy. Tolerated the procedure well. His diet was advanced at difficulty. He is being discharged home on postoperative day one in good improving condition.  Treatments: surgery: Laparoscopic appendectomy on 12/12/2011  Discharge Exam: Blood pressure 111/70, pulse 72, temperature 98.2 F (36.8 C), temperature source Oral, resp. rate 18, height 5\' 8"  (1.727 m), weight 72.576 kg (160 lb), SpO2 95.00%. General appearance: alert and cooperative Resp: clear to auscultation bilaterally Cardio: regular rate and rhythm, S1, S2 normal, no murmur, click, rub or gallop GI: Soft, flat. Dressings dry and intact.  Disposition: 01-Home or Self Care   Medication List  As of 12/13/2011  8:56 AM   TAKE these medications         oxyCODONE-acetaminophen 7.5-325 MG per tablet   Commonly known as: PERCOCET   Take 1-2 tablets by mouth every 4 (four) hours as needed for pain.      phenytoin 100 MG ER capsule   Commonly known as: DILANTIN   Take 200-300 mg by mouth 2 (two) times daily. 2 capsules am, 3 capsule pm      propranolol 20 MG tablet   Commonly known as: INDERAL   Take 20 mg by mouth 2 (two) times daily.      ranitidine 300 MG capsule   Commonly known as: ZANTAC   Take 300 mg by mouth every evening.           Follow-up Information    Follow up with Dalia Heading, MD. Schedule an  appointment as soon as possible for a visit on 12/25/2011.   Contact information:   [address] [place] [address] [phone]          Signed: Dalia Heading 12/13/2011, 8:56 AM

## 2011-12-14 ENCOUNTER — Encounter (HOSPITAL_COMMUNITY): Payer: Self-pay | Admitting: General Surgery

## 2012-03-21 ENCOUNTER — Emergency Department (HOSPITAL_COMMUNITY)
Admission: EM | Admit: 2012-03-21 | Discharge: 2012-03-21 | Disposition: A | Discharge status: Home / Self Care | Payer: Medicaid Other | Attending: Emergency Medicine | Admitting: Emergency Medicine

## 2012-03-21 ENCOUNTER — Encounter (HOSPITAL_COMMUNITY): Payer: Self-pay | Admitting: *Deleted

## 2012-03-21 DIAGNOSIS — Z79899 Other long term (current) drug therapy: Secondary | ICD-10-CM | POA: Insufficient documentation

## 2012-03-21 DIAGNOSIS — I10 Essential (primary) hypertension: Secondary | ICD-10-CM | POA: Insufficient documentation

## 2012-03-21 DIAGNOSIS — R569 Unspecified convulsions: Secondary | ICD-10-CM | POA: Insufficient documentation

## 2012-03-21 LAB — CBC WITH DIFFERENTIAL/PLATELET
Basophils Absolute: 0 10*3/uL (ref 0.0–0.1)
Basophils Relative: 0 % (ref 0–1)
Eosinophils Absolute: 0.1 10*3/uL (ref 0.0–0.7)
Hemoglobin: 16 g/dL (ref 13.0–17.0)
MCH: 34.5 pg — ABNORMAL HIGH (ref 26.0–34.0)
MCHC: 36.3 g/dL — ABNORMAL HIGH (ref 30.0–36.0)
Monocytes Absolute: 0.7 10*3/uL (ref 0.1–1.0)
Monocytes Relative: 10 % (ref 3–12)
Neutrophils Relative %: 66 % (ref 43–77)
RDW: 12.8 % (ref 11.5–15.5)

## 2012-03-21 LAB — PHENYTOIN LEVEL, TOTAL: Phenytoin Lvl: <2.5 ug/mL — ABNORMAL LOW (ref 10.0–20.0)

## 2012-03-21 MED ORDER — SODIUM CHLORIDE 0.9 % IV SOLN
1000.0000 mg | Freq: Once | INTRAVENOUS | Status: AC
  Administered 2012-03-21: 1000 mg via INTRAVENOUS
  Filled 2012-03-21: qty 20

## 2012-03-21 MED ORDER — SODIUM CHLORIDE 0.9 % IV BOLUS (SEPSIS)
1000.0000 mL | Freq: Once | INTRAVENOUS | Status: AC
  Administered 2012-03-21: 1000 mL via INTRAVENOUS

## 2012-03-21 MED ORDER — POTASSIUM CHLORIDE CRYS ER 20 MEQ PO TBCR
40.0000 meq | EXTENDED_RELEASE_TABLET | Freq: Once | ORAL | Status: AC
  Administered 2012-03-21: 40 meq via ORAL
  Filled 2012-03-21: qty 2

## 2012-03-21 MED ORDER — LORAZEPAM 2 MG/ML IJ SOLN
1.0000 mg | Freq: Once | INTRAMUSCULAR | Status: AC
  Administered 2012-03-21: 1 mg via INTRAVENOUS
  Filled 2012-03-21: qty 1

## 2012-03-21 MED ORDER — LIDOCAINE HCL (PF) 1 % IJ SOLN
INTRAMUSCULAR | Status: AC
  Filled 2012-03-21: qty 5

## 2012-03-21 MED ORDER — OXYCODONE-ACETAMINOPHEN 5-325 MG PO TABS
1.0000 | ORAL_TABLET | Freq: Once | ORAL | Status: AC
  Administered 2012-03-21: 1 via ORAL
  Filled 2012-03-21 (×2): qty 1

## 2012-03-21 NOTE — ED Notes (Signed)
Patient attempting to pull out IV. Patient has pulled off leads. Patient stating he is very upset with MD due to MD stating he is not taking his medication. Patient reports he has been taking his medication regularly. Patient refused to wait on discharge paperwork, patient verbalized understanding of dr. Maxwell Marion discharge paperwork. Patient stated he would call to file a complaint. Apologized about situation. IV removed. Patient stood up and stumbled, offered wheelchair and patient refused multiple times, walked out with patient to car where his significant other was driving him.
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
## 2012-03-21 NOTE — ED Provider Notes (Signed)
History    This chart was scribed for Joya Gaskins, MD, MD by Smitty Pluck. The patient was seen in room APA04 and the patient's care was started at 2:25PM.   CSN: [id number]  Arrival date & time 03/21/12  1418      Chief Complaint  Patient presents with  . Seizures     Patient is a 36 y.o. male presenting with seizures. The history is provided by the patient and the EMS personnel. No language interpreter was used.  Seizures  This is a chronic problem. The current episode started less than 1 hour ago. The problem has been resolved. There was 1 seizure. The most recent episode lasted 2 to 5 minutes. Associated symptoms include headaches and vomiting. Characteristics do not include bit tongue. The episode was witnessed. The seizures did not continue in the ED. There has been no fever.   Alexander Collier is a 36 y.o. male with hx of seizures who presents to the Emergency Department BIB EMS due to grand mal seizure today lasting 2 minutes. Per EMS pt was sitting in passenger seat of truck when seizure occurred. Pt was post ictal upon EMS arrival at scene. EMS reports that CBG was 113. 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    Past Medical History  Diagnosis Date  . Seizures     Epilepsy  . Hypertension     Past Surgical History  Procedure Date  . Brain surgery   . Orthopedic surgery   . Laparoscopic appendectomy 12/12/2011    Procedure: APPENDECTOMY LAPAROSCOPIC;  Surgeon: Dalia Heading, MD;  Location: [facility];  Service: General;  Laterality: N/A;    No family history on file.  History  Substance Use Topics  . Smoking status: Never Smoker   . Smokeless tobacco: Current User    Types: Snuff  . Alcohol Use: No      Review of Systems  Gastrointestinal: Positive for vomiting.  Neurological: Positive for seizures and headaches.  All other systems reviewed and are negative.    Allergies  Review of patient's allergies indicates no known  allergies.  Home Medications   Current Outpatient Rx  Name Route Sig Dispense Refill  . OXYCODONE-ACETAMINOPHEN 7.5-325 MG PO TABS Oral Take 1-2 tablets by mouth every 4 (four) hours as needed for pain. 50 tablet 0  . PHENYTOIN SODIUM EXTENDED 100 MG PO CAPS Oral Take 200-300 mg by mouth 2 (two) times daily. 2 capsules am, 3 capsule pm    . PROPRANOLOL HCL 20 MG PO TABS Oral Take 20 mg by mouth 2 (two) times daily.    Marland Kitchen RANITIDINE HCL 300 MG PO CAPS Oral Take 300 mg by mouth every evening.      BP 125/73  Pulse 118  Temp 98.3 F (36.8 C)  Resp 20  SpO2 95% BP 125/73  Pulse 97  Temp 98.3 F (36.8 C)  Resp 11  SpO2 96%   Physical Exam  Nursing note and vitals reviewed.  CONSTITUTIONAL: Well developed/well nourished, appears confused  HEAD AND FACE: Normocephalic/atraumatic. Well healed craniotomy scar EYES: EOMI/PERRL ENMT: Mucous membranes moist, no tongue laceration  NECK: supple no meningeal signs SPINE:entire spine nontender CV: S1/S2 noted, no murmurs/rubs/gallops noted LUNGS: Lungs are clear to auscultation bilaterally, no apparent distress ABDOMEN: soft, nontender, no rebound or guarding GU:no cva tenderness NEURO: Pt is awake/alert, moves all extremitiesx4, pt confused but alert EXTREMITIES: pulses normal, full ROM SKIN: warm, color normal PSYCH: no abnormalities of  mood noted  ED Course  Procedures DIAGNOSTIC STUDIES: Oxygen Saturation is 95% on room air, normal by my interpretation.    COORDINATION OF CARE: 2:30 PM Discussed ED treatment with pt  2:30 PM Ordered:     . LORazepam  1 mg Intravenous Once      Labs Reviewed  BASIC METABOLIC PANEL  CBC WITH DIFFERENTIAL  CK  PHENYTOIN LEVEL, TOTAL  4:19 PM Pt now back to baseline Admits last seizure was a month ago He reports he feels diffusely sore.  No focal neuro deficits.   He reports med compliance though level is negative Will IV load here in the ED while on monitor He will f/u as  outpatient Discussed that he can not drive/bathe alone.  He understands risk of heights with h/o seizure D/w dr Deretha Emory, will f/u on patient after IV PTN load,  Will need to ambulate and then safe for d/c home.  He will not take tonights dose    MDM  Nursing notes including past medical history and social history reviewed and considered in documentation Labs/vital reviewed and considered      I personally performed the services described in this documentation, which was scribed in my presence. The recorded information has been reviewed and considered.       Joya Gaskins, MD 03/21/12 1622

## 2012-03-21 NOTE — ED Notes (Signed)
Patient has ripped off monitor, b/p cuff and pulse ox.  Patient stated he was very angry after talking with doctor.  Notified nurse of the situation.

## 2012-03-22 LAB — BASIC METABOLIC PANEL
BUN: 5 mg/dL — ABNORMAL LOW (ref 6–23)
Creatinine, Ser: 1.15 mg/dL (ref 0.50–1.35)
GFR calc non Af Amer: 79 mL/min — ABNORMAL LOW (ref >90)
Glucose, Bld: 114 mg/dL — ABNORMAL HIGH (ref 70–99)
Potassium: 3.3 mEq/L — ABNORMAL LOW (ref 3.5–5.1)

## 2013-01-16 ENCOUNTER — Ambulatory Visit (INDEPENDENT_AMBULATORY_CARE_PROVIDER_SITE_OTHER): Payer: Medicare Other | Admitting: Family Medicine

## 2013-01-16 ENCOUNTER — Encounter: Payer: Self-pay | Admitting: Family Medicine

## 2013-01-16 VITALS — BP 128/88 | HR 92 | Temp 98.1°F | Resp 18 | Ht 67.0 in | Wt 155.0 lb

## 2013-01-16 DIAGNOSIS — G43909 Migraine, unspecified, not intractable, without status migrainosus: Secondary | ICD-10-CM | POA: Insufficient documentation

## 2013-01-16 DIAGNOSIS — I10 Essential (primary) hypertension: Secondary | ICD-10-CM | POA: Insufficient documentation

## 2013-01-16 DIAGNOSIS — G40909 Epilepsy, unspecified, not intractable, without status epilepticus: Secondary | ICD-10-CM

## 2013-01-16 DIAGNOSIS — F17200 Nicotine dependence, unspecified, uncomplicated: Secondary | ICD-10-CM

## 2013-01-16 DIAGNOSIS — M419 Scoliosis, unspecified: Secondary | ICD-10-CM | POA: Insufficient documentation

## 2013-01-16 DIAGNOSIS — Z7189 Other specified counseling: Secondary | ICD-10-CM

## 2013-01-16 DIAGNOSIS — N189 Chronic kidney disease, unspecified: Secondary | ICD-10-CM | POA: Insufficient documentation

## 2013-01-16 DIAGNOSIS — E785 Hyperlipidemia, unspecified: Secondary | ICD-10-CM

## 2013-01-16 DIAGNOSIS — R569 Unspecified convulsions: Secondary | ICD-10-CM | POA: Insufficient documentation

## 2013-01-16 DIAGNOSIS — Z716 Tobacco abuse counseling: Secondary | ICD-10-CM

## 2013-01-16 DIAGNOSIS — M4120 Other idiopathic scoliosis, site unspecified: Secondary | ICD-10-CM

## 2013-01-16 LAB — COMPLETE METABOLIC PANEL WITH GFR
CO2: 28 mEq/L (ref 19–32)
Creat: 1.18 mg/dL (ref 0.50–1.35)
GFR, Est African American: 89 mL/min
GFR, Est Non African American: 77 mL/min
Glucose, Bld: 92 mg/dL (ref 70–99)
Sodium: 138 mEq/L (ref 135–145)
Total Bilirubin: 0.3 mg/dL (ref 0.3–1.2)
Total Protein: 7.3 g/dL (ref 6.0–8.3)

## 2013-01-16 LAB — CBC WITH DIFFERENTIAL/PLATELET
Eosinophils Absolute: 0.1 10*3/uL (ref 0.0–0.7)
Eosinophils Relative: 1 % (ref 0–5)
HCT: 46.2 % (ref 39.0–52.0)
Hemoglobin: 16.4 g/dL (ref 13.0–17.0)
Lymphs Abs: 1.4 10*3/uL (ref 0.7–4.0)
MCH: 35.1 pg — ABNORMAL HIGH (ref 26.0–34.0)
MCV: 98.9 fL (ref 78.0–100.0)
Monocytes Absolute: 0.4 10*3/uL (ref 0.1–1.0)
Monocytes Relative: 7 % (ref 3–12)
RBC: 4.67 MIL/uL (ref 4.22–5.81)

## 2013-01-16 LAB — LIPID PANEL: HDL: 31 mg/dL — ABNORMAL LOW (ref >39)

## 2013-01-16 MED ORDER — RANITIDINE HCL 300 MG PO CAPS: 300.0000 mg | ORAL_CAPSULE | Freq: Every day | ORAL | Status: DC
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
## 2013-01-21 LAB — CARBAMAZEPINE, FREE AND TOTAL
Carbamazepine Metabolite -: 1.4 ug/mL (ref 0.2–2.0)
Carbamazepine Metabolite: 0.8 ug/mL (ref 0.1–1.0)
Carbamazepine, Bound: 3.1 ug/mL
Carbamazepine, Free: 0.8 ug/mL — ABNORMAL LOW (ref 1.0–3.0)

## 2013-01-28 ENCOUNTER — Other Ambulatory Visit: Payer: Self-pay | Admitting: Family Medicine

## 2013-01-28 MED ORDER — PHENYTOIN SODIUM EXTENDED 100 MG PO CAPS: ORAL_CAPSULE | ORAL | Status: DC

## 2013-01-28 MED ORDER — PROPRANOLOL HCL 20 MG PO TABS: 20.0000 mg | ORAL_TABLET | Freq: Two times a day (BID) | ORAL | Status: DC

## 2013-01-28 MED ORDER — CARBAMAZEPINE 200 MG PO TABS: 200.0000 mg | ORAL_TABLET | Freq: Two times a day (BID) | ORAL | Status: DC

## 2013-01-28 NOTE — Telephone Encounter (Signed)
Rx Refilled  

## 2013-02-04 ENCOUNTER — Encounter: Payer: Self-pay | Admitting: *Deleted

## 2013-02-11 ENCOUNTER — Encounter: Payer: Self-pay | Admitting: Family Medicine

## 2013-02-13 ENCOUNTER — Encounter: Payer: Self-pay | Admitting: Family Medicine

## 2013-02-13 ENCOUNTER — Ambulatory Visit (INDEPENDENT_AMBULATORY_CARE_PROVIDER_SITE_OTHER): Payer: Medicare Other | Admitting: Family Medicine

## 2013-02-13 VITALS — BP 110/70 | HR 88 | Temp 97.8°F | Wt 152.0 lb

## 2013-02-13 DIAGNOSIS — R21 Rash and other nonspecific skin eruption: Secondary | ICD-10-CM

## 2013-02-13 DIAGNOSIS — J069 Acute upper respiratory infection, unspecified: Secondary | ICD-10-CM

## 2013-02-13 MED ORDER — CLOTRIMAZOLE-BETAMETHASONE 1-0.05 % EX CREA: TOPICAL_CREAM | Freq: Two times a day (BID) | CUTANEOUS | Status: DC

## 2013-02-13 MED ORDER — PREDNISONE 20 MG PO TABS: ORAL_TABLET | ORAL | Status: DC

## 2013-02-13 NOTE — Progress Notes (Signed)
  Subjective:    Patient ID: Alexander Collier, male    DOB: 01/30/1976, 37 y.o.   MRN: [id number]  HPI  Patient presents with fatigue mild cough sinus pressure and drainage for the past 3 days. He also noticed that he has a red itchy rash that is been popping up on him for the past couple of days. He denies any sick contacts. Denies any poison ivy contact. No change in his soap or lotion. He does admit to some fever and body aches as well. OTC cough med being used currently  Review of Systems  GEN- denies fatigue, fever, weight loss,weakness, recent illness HEENT- denies eye drainage, change in vision, nasal discharge, CVS- denies chest pain, palpitations RESP- denies SOB, cough, wheeze ABD- denies N/V, change in stools, abd pain GU- denies dysuria, hematuria, dribbling, incontinence MSK- denies joint pain, muscle aches, injury Neuro- denies headache, dizziness, syncope, seizure activity      Objective:   Physical Exam GEN- NAD, alert and oriented x3 HEENT- PERRL, EOMI, non injected sclera, pink conjunctiva, MMM, oropharynx clear, clear rhinorrhea, Tm clear bialt, no maxillary sinus tenderness Neck- Supple, no LAD CVS- RRR, no murmur RESP-CTAB Skin- multiple erythematous maculopapular lesions scattered on arms, back, legs. Some scaley erythematous lesions scattered on arms- alopecia in these areas. Large erythematous scaley appearing lesion on his left foot. No lesions in webs of fingers,  EXT- No edema Pulses- Radial, DP- 2+        Assessment & Plan:

## 2013-02-13 NOTE — Patient Instructions (Signed)
Take the prednisone as prescribed for the rash Use the cream twice a day for the next 2 weeks  You have an upper respiratory infection  Okay to use mucinex, use nasal saline F/U if rash does not improve

## 2013-02-15 ENCOUNTER — Encounter: Payer: Self-pay | Admitting: Family Medicine

## 2013-02-15 DIAGNOSIS — J069 Acute upper respiratory infection, unspecified: Secondary | ICD-10-CM | POA: Insufficient documentation

## 2013-02-15 DIAGNOSIS — R21 Rash and other nonspecific skin eruption: Secondary | ICD-10-CM | POA: Insufficient documentation

## 2013-02-15 NOTE — Assessment & Plan Note (Signed)
Supportive care, fluids, nasal saline, symptoms only for 3 days

## 2013-02-15 NOTE — Assessment & Plan Note (Signed)
He appears to have 2 different types of rash, the rash on foot looks a little more fungal, compared to scattered lesions on Upper ext and back I will give prednisone and topical lotrisone to use

## 2013-02-17 ENCOUNTER — Telehealth: Payer: Self-pay | Admitting: Family Medicine

## 2013-02-17 MED ORDER — AZITHROMYCIN 250 MG PO TABS: ORAL_TABLET | ORAL | Status: DC

## 2013-02-17 NOTE — Telephone Encounter (Signed)
Wife called back and made aware of RX and other provider recommendations

## 2013-02-17 NOTE — Telephone Encounter (Signed)
Call in Z pak, give robitussin DM over the counter Complete the prednisone I gave

## 2013-02-17 NOTE — Telephone Encounter (Signed)
Wife calling.  Husband still sick with a lot of cough and congestion.  Having some shortness of breath.  Would like antibiotic or what do you recommend?

## 2013-04-16 ENCOUNTER — Other Ambulatory Visit: Payer: Self-pay | Admitting: Family Medicine

## 2013-05-06 ENCOUNTER — Telehealth: Payer: Self-pay | Admitting: Family Medicine

## 2013-05-06 NOTE — Telephone Encounter (Signed)
Pt is now seeing Dr. Gerilyn Pilgrim for his seizure disorder so we do not need to follow up with that as long as Dr. Gerilyn Pilgrim is managing his medications. I told Alexander Collier that we only needed to see him every 6 months to follow up on his BP.

## 2013-05-06 NOTE — Telephone Encounter (Signed)
Alexander Collier has a question about Alexander Collier's about apt that Alexander Collier has on Monday Alexander Collier is wondering if they need to keep this apt bc they just changed his medication at another apt  Alexander Collier call back number is [phone]

## 2013-05-11 ENCOUNTER — Ambulatory Visit: Payer: Medicare Other | Admitting: Family Medicine

## 2013-07-01 ENCOUNTER — Encounter: Payer: Self-pay | Admitting: Family Medicine

## 2013-07-12 ENCOUNTER — Encounter (HOSPITAL_COMMUNITY): Payer: Self-pay | Admitting: Emergency Medicine

## 2013-07-12 ENCOUNTER — Emergency Department (HOSPITAL_COMMUNITY)
Admission: EM | Admit: 2013-07-12 | Discharge: 2013-07-12 | Disposition: A | Discharge status: Home / Self Care | Payer: Medicare HMO | Attending: Emergency Medicine | Admitting: Emergency Medicine

## 2013-07-12 DIAGNOSIS — Z8739 Personal history of other diseases of the musculoskeletal system and connective tissue: Secondary | ICD-10-CM | POA: Insufficient documentation

## 2013-07-12 DIAGNOSIS — IMO0002 Reserved for concepts with insufficient information to code with codable children: Secondary | ICD-10-CM | POA: Insufficient documentation

## 2013-07-12 DIAGNOSIS — E876 Hypokalemia: Secondary | ICD-10-CM | POA: Insufficient documentation

## 2013-07-12 DIAGNOSIS — Z9889 Other specified postprocedural states: Secondary | ICD-10-CM | POA: Insufficient documentation

## 2013-07-12 DIAGNOSIS — X58XXXA Exposure to other specified factors, initial encounter: Secondary | ICD-10-CM | POA: Insufficient documentation

## 2013-07-12 DIAGNOSIS — G43909 Migraine, unspecified, not intractable, without status migrainosus: Secondary | ICD-10-CM | POA: Insufficient documentation

## 2013-07-12 DIAGNOSIS — G40909 Epilepsy, unspecified, not intractable, without status epilepticus: Secondary | ICD-10-CM | POA: Insufficient documentation

## 2013-07-12 DIAGNOSIS — N189 Chronic kidney disease, unspecified: Secondary | ICD-10-CM | POA: Insufficient documentation

## 2013-07-12 DIAGNOSIS — R1110 Vomiting, unspecified: Secondary | ICD-10-CM | POA: Insufficient documentation

## 2013-07-12 DIAGNOSIS — Y9389 Activity, other specified: Secondary | ICD-10-CM | POA: Insufficient documentation

## 2013-07-12 DIAGNOSIS — I129 Hypertensive chronic kidney disease with stage 1 through stage 4 chronic kidney disease, or unspecified chronic kidney disease: Secondary | ICD-10-CM | POA: Insufficient documentation

## 2013-07-12 DIAGNOSIS — Z79899 Other long term (current) drug therapy: Secondary | ICD-10-CM | POA: Insufficient documentation

## 2013-07-12 DIAGNOSIS — Y92009 Unspecified place in unspecified non-institutional (private) residence as the place of occurrence of the external cause: Secondary | ICD-10-CM | POA: Insufficient documentation

## 2013-07-12 DIAGNOSIS — R569 Unspecified convulsions: Secondary | ICD-10-CM

## 2013-07-12 LAB — BASIC METABOLIC PANEL
BUN: 9 mg/dL (ref 6–23)
CHLORIDE: 97 meq/L (ref 96–112)
CO2: 25 mEq/L (ref 19–32)
CREATININE: 1.35 mg/dL (ref 0.50–1.35)
Calcium: 8.8 mg/dL (ref 8.4–10.5)
GFR, EST AFRICAN AMERICAN: 75 mL/min — AB (ref >90)
GFR, EST NON AFRICAN AMERICAN: 64 mL/min — AB (ref >90)
Glucose, Bld: 149 mg/dL — ABNORMAL HIGH (ref 70–99)
POTASSIUM: 3 meq/L — AB (ref 3.7–5.3)
Sodium: 137 mEq/L (ref 137–147)

## 2013-07-12 LAB — PHENYTOIN LEVEL, TOTAL

## 2013-07-12 LAB — CARBAMAZEPINE LEVEL, TOTAL: CARBAMAZEPINE LVL: 0.6 ug/mL — AB (ref 4.0–12.0)

## 2013-07-12 MED ORDER — POTASSIUM CHLORIDE ER 10 MEQ PO TBCR: 10.0000 meq | EXTENDED_RELEASE_TABLET | Freq: Every day | ORAL | Status: DC

## 2013-07-12 MED ORDER — ACETAMINOPHEN 500 MG PO TABS
1000.0000 mg | ORAL_TABLET | Freq: Once | ORAL | Status: AC
  Administered 2013-07-12: 1000 mg via ORAL
  Filled 2013-07-12: qty 2

## 2013-07-12 MED ORDER — ONDANSETRON HCL 4 MG/2ML IJ SOLN
4.0000 mg | Freq: Once | INTRAMUSCULAR | Status: AC
  Administered 2013-07-12: 4 mg via INTRAVENOUS
  Filled 2013-07-12: qty 2

## 2013-07-12 MED ORDER — SODIUM CHLORIDE 0.9 % IV SOLN
1000.0000 mg | Freq: Once | INTRAVENOUS | Status: AC
  Administered 2013-07-12: 1000 mg via INTRAVENOUS
  Filled 2013-07-12: qty 20

## 2013-07-12 MED ORDER — LORAZEPAM 2 MG/ML IJ SOLN
1.0000 mg | Freq: Once | INTRAMUSCULAR | Status: AC
  Administered 2013-07-12: 1 mg via INTRAVENOUS
  Filled 2013-07-12: qty 1

## 2013-07-12 NOTE — ED Notes (Signed)
Pt's wife administering pt's own Tegretol at this time per request of Dr Hyacinth MeekerMiller.
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
## 2013-07-12 NOTE — Discharge Instructions (Signed)
Your blood work showed 2 things:  #1 - your potassium level was slightly low - take the supplement as prescribed and have your level rechecked in the next 10 days  #2 - Your dilantin (phenytoin) level was undetectable - we have given this to you by IV - you MUST take your medications as directed by your doctor or you may have continuing or more severe seizures.  Please call your doctor for a followup appointment within 24-48 hours. When you talk to your doctor please let them know that you were seen in the emergency department and have them acquire all of your records so that they can discuss the findings with you and formulate a treatment plan to fully care for your new and ongoing problems.

## 2013-07-12 NOTE — ED Notes (Signed)
Pt in by ems after reportedly having seizure at home witnessed by wife

## 2013-07-16 ENCOUNTER — Encounter: Payer: Self-pay | Admitting: Family Medicine

## 2013-07-16 ENCOUNTER — Ambulatory Visit (INDEPENDENT_AMBULATORY_CARE_PROVIDER_SITE_OTHER): Payer: Medicare HMO | Admitting: Family Medicine

## 2013-07-16 VITALS — BP 130/98 | HR 86 | Temp 97.9°F | Resp 18 | Ht 67.0 in | Wt 164.0 lb

## 2013-07-16 DIAGNOSIS — G40909 Epilepsy, unspecified, not intractable, without status epilepticus: Secondary | ICD-10-CM

## 2013-07-16 DIAGNOSIS — Z09 Encounter for follow-up examination after completed treatment for conditions other than malignant neoplasm: Secondary | ICD-10-CM

## 2013-07-16 LAB — BASIC METABOLIC PANEL
BUN: 12 mg/dL (ref 6–23)
CALCIUM: 10.1 mg/dL (ref 8.4–10.5)
CO2: 30 meq/L (ref 19–32)
CREATININE: 1.67 mg/dL — AB (ref 0.50–1.35)
Chloride: 97 mEq/L (ref 96–112)
Glucose, Bld: 76 mg/dL (ref 70–99)
Potassium: 4.7 mEq/L (ref 3.5–5.3)
Sodium: 139 mEq/L (ref 135–145)

## 2013-07-16 LAB — PHENYTOIN LEVEL, TOTAL: PHENYTOIN LVL: 8.8 ug/mL — AB (ref 10.0–20.0)

## 2013-07-16 LAB — CARBAMAZEPINE LEVEL, TOTAL: CARBAMAZEPINE LVL: 6.7 ug/mL (ref 4.0–12.0)

## 2013-07-16 MED ORDER — PROPRANOLOL HCL 20 MG PO TABS: ORAL_TABLET | ORAL | Status: DC

## 2013-07-16 NOTE — Progress Notes (Signed)
Subjective:    Patient ID: Alexander Collier, male    DOB: [birth year], 38 y.o.   MRN: [id number]  HPI Patient went to the emergency room with repeated seizures on February 8.  He is supposed to be on Tegretol-XR 200 mg by mouth every morning and 400 mg by mouth every afternoon.  He is also supposed to be taking Dilantin extended release 200 mg by mouth twice a day. In the emergency room his Tegretol level was 0.6. His Dilantin level was 2.6.  Levels are subtherapeutic suggesting the patient is not taking his seizure medicines. He is here today for followup after he was IV loaded with the medication.  He is here today with his aunt.  She monitors him in the morning and watches him take his medication. In the evenings the patient takes his medication on his own. However she did not see the patient on Friday so it is possible that he did not take the medication Friday. He definitely did not take the medication Saturday morning. He had the seizure Saturday evening before he took his evening that is. Therefore it is possible that the patient may have missed 4 doses of his medication.  Since leaving the hospital, the patient has been acting very "slow" and drowsy. His pupils are equal round and reactive to light. His reflexes are normal throughout however they are not depressed. He has no evidence of ataxia but he is slightly delayed. There is no slurred speech. Past Medical History  Diagnosis Date  . Migraines   . Hypertension   . Seizures     Epilepsy  . Scoliosis   . Chronic kidney disease     one kidney, donated to aunt   Current Outpatient Prescriptions on File Prior to Visit  Medication Sig Dispense Refill  . carbamazepine (TEGRETOL XR) 200 MG 12 hr tablet 200 mg. 1 tab po qam & 2 tabs po qpm      . clotrimazole-betamethasone (LOTRISONE) cream Apply topically 2 (two) times daily.  30 g  0  . phenytoin (DILANTIN) 100 MG ER capsule 2 tab BID  360 capsule  1  . potassium chloride (K-DUR) 10 MEQ tablet  Take 1 tablet (10 mEq total) by mouth daily.  10 tablet  0  . ranitidine (ZANTAC) 300 MG capsule Take 1 capsule (300 mg total) by mouth daily.  90 capsule  1   No current facility-administered medications on file prior to visit.   No Known Allergies History   Social History  . Marital Status: Married    Spouse Name: N/A    Number of Children: N/A  . Years of Education: N/A   Occupational History  . Not on file.   Social History Main Topics  . Smoking status: Never Smoker   . Smokeless tobacco: Current User    Types: Snuff  . Alcohol Use: No  . Drug Use: No  . Sexual Activity: Not on file     Comment: married, 3 kids.  Disabled due to seizures.   Other Topics Concern  . Not on file   Social History Narrative  . No narrative on file    Review of Systems  All other systems reviewed and are negative.       Objective:   Physical Exam  Vitals reviewed. Constitutional: He is oriented to person, place, and time.  Eyes: Conjunctivae and EOM are normal. Pupils are equal, round, and reactive to light.  Neck: Neck supple. No JVD present.  Cardiovascular: Normal rate, regular rhythm and normal heart sounds.   No murmur heard. Pulmonary/Chest: Effort normal and breath sounds normal. No respiratory distress. He has no wheezes. He has no rales.  Lymphadenopathy:    He has no cervical adenopathy.  Neurological: He is alert and oriented to person, place, and time. He has normal reflexes. He displays normal reflexes. No cranial nerve deficit. He exhibits normal muscle tone. Coordination normal.          Assessment & Plan:  1. Hospital discharge follow-up - Carbamazepine level, free - Phenytoin level, total - Phenytoin level, free - Carbamazepine level, total - Ambulatory referral to Neurology  2. Seizure disorder - Carbamazepine level, free - Phenytoin level, total - Phenytoin level, free - Carbamazepine level, total - Basic Metabolic Panel - Ambulatory referral to  Neurology  I believe the patient had a breakthrough seizure because he missed several doses of his medication. I will check Dilantin as well as carbamazepine levels. If they have risen significantly since his hospitalization this would indicate that he is missing doses. I also want lateral to make sure he is not supratherapeutic on the medication. I also consult [facility] because I do believe this patient would benefit from seeing a neurologist to help control his refractory seizures.

## 2013-07-18 ENCOUNTER — Other Ambulatory Visit: Payer: Self-pay | Admitting: Family Medicine

## 2013-07-18 DIAGNOSIS — R899 Unspecified abnormal finding in specimens from other organs, systems and tissues: Secondary | ICD-10-CM

## 2013-07-19 LAB — CARBAMAZEPINE, FREE AND TOTAL
CARBAMAZEPINE METABOLITE -: 1.5 ug/mL (ref 0.2–2.0)
Carbamazepine Metabolite/: 0.6 ug/mL
Carbamazepine Metabolite: 0.9 ug/mL (ref 0.1–1.0)
Carbamazepine, Bound: 3.6 ug/mL
Carbamazepine, Free: 1.1 ug/mL (ref 1.0–3.0)
Carbamazepine, Total: 4.7 ug/mL (ref 4.0–12.0)

## 2013-07-21 LAB — PHENYTOIN LEVEL, FREE AND TOTAL
PHENYTOIN BOUND: 7 mg/L
PHENYTOIN FREE: 1 mg/L (ref 1.0–2.0)
PHENYTOIN, TOTAL: 8 mg/L — AB (ref 10.0–20.0)

## 2013-07-27 ENCOUNTER — Encounter (HOSPITAL_COMMUNITY): Payer: Self-pay | Admitting: Emergency Medicine

## 2013-07-27 ENCOUNTER — Emergency Department (HOSPITAL_COMMUNITY)
Admission: EM | Admit: 2013-07-27 | Discharge: 2013-07-27 | Disposition: A | Discharge status: Home / Self Care | Payer: Medicare HMO | Attending: Emergency Medicine | Admitting: Emergency Medicine

## 2013-07-27 ENCOUNTER — Ambulatory Visit: Payer: Medicare HMO | Admitting: Neurology

## 2013-07-27 ENCOUNTER — Emergency Department (HOSPITAL_COMMUNITY): Payer: Medicare HMO

## 2013-07-27 DIAGNOSIS — I129 Hypertensive chronic kidney disease with stage 1 through stage 4 chronic kidney disease, or unspecified chronic kidney disease: Secondary | ICD-10-CM | POA: Insufficient documentation

## 2013-07-27 DIAGNOSIS — R059 Cough, unspecified: Secondary | ICD-10-CM | POA: Insufficient documentation

## 2013-07-27 DIAGNOSIS — Y9389 Activity, other specified: Secondary | ICD-10-CM | POA: Insufficient documentation

## 2013-07-27 DIAGNOSIS — R05 Cough: Secondary | ICD-10-CM | POA: Insufficient documentation

## 2013-07-27 DIAGNOSIS — S2239XA Fracture of one rib, unspecified side, initial encounter for closed fracture: Secondary | ICD-10-CM

## 2013-07-27 DIAGNOSIS — G40909 Epilepsy, unspecified, not intractable, without status epilepticus: Secondary | ICD-10-CM | POA: Insufficient documentation

## 2013-07-27 DIAGNOSIS — IMO0002 Reserved for concepts with insufficient information to code with codable children: Secondary | ICD-10-CM | POA: Insufficient documentation

## 2013-07-27 DIAGNOSIS — Z79899 Other long term (current) drug therapy: Secondary | ICD-10-CM | POA: Insufficient documentation

## 2013-07-27 DIAGNOSIS — Z8739 Personal history of other diseases of the musculoskeletal system and connective tissue: Secondary | ICD-10-CM | POA: Insufficient documentation

## 2013-07-27 DIAGNOSIS — X58XXXA Exposure to other specified factors, initial encounter: Secondary | ICD-10-CM | POA: Insufficient documentation

## 2013-07-27 DIAGNOSIS — Y929 Unspecified place or not applicable: Secondary | ICD-10-CM | POA: Insufficient documentation

## 2013-07-27 DIAGNOSIS — N189 Chronic kidney disease, unspecified: Secondary | ICD-10-CM | POA: Insufficient documentation

## 2013-07-27 MED ORDER — OXYCODONE-ACETAMINOPHEN 5-325 MG PO TABS: 1.0000 | ORAL_TABLET | ORAL | Status: DC | PRN

## 2013-07-27 MED ORDER — BENZONATATE 200 MG PO CAPS: 200.0000 mg | ORAL_CAPSULE | Freq: Three times a day (TID) | ORAL | Status: DC | PRN

## 2013-07-27 MED ORDER — NAPROXEN 500 MG PO TABS: 500.0000 mg | ORAL_TABLET | Freq: Two times a day (BID) | ORAL | Status: DC

## 2013-07-27 MED ORDER — HYDROCOD POLST-CHLORPHEN POLST 10-8 MG/5ML PO LQCR
5.0000 mL | Freq: Once | ORAL | Status: AC
  Administered 2013-07-27: 5 mL via ORAL
  Filled 2013-07-27: qty 5

## 2013-07-27 NOTE — Discharge Instructions (Signed)
Rib Fracture  A rib fracture is a break or crack in one of the bones of the ribs. The ribs are like a cage that goes around your upper chest. A broken or cracked rib is often painful, but most do not cause other problems. Most rib fractures heal on their own in 1 3 months.  HOME CARE   Avoid activities that cause pain to the injured area. Protect your injured area.   Slowly increase activity as told by your doctor.   Take medicine as told by your doctor.   Put ice on the injured area for the first 1 2 days after you have been treated or as told by your doctor.   Put ice in a plastic bag.   Place a towel between your skin and the bag.   Leave the ice on for 15 20 minutes at a time, every 2 hours while you are awake.   Do deep breathing as told by your doctor. You may be told to:   Take deep breaths many times a day.   Cough many times a day while hugging a pillow.   Use a device (incentive spirometer) to perform deep breathing many times a day.   Drink enough fluids to keep your pee (urine) clear or pale yellow.    Do not wear a rib belt or binder. These do not allow you to breathe deeply.  GET HELP RIGHT AWAY IF:    You have a fever.   You have trouble breathing.    You cannot stop coughing.   You cough up thick or bloody spit (mucus).    You feel sick to your stomach (nauseous), throw up (vomit), or have belly (abdominal) pain.    Your pain gets worse and medicine does not help.   MAKE SURE YOU:    Understand these instructions.   Will watch your condition.   Will get help right away if you are not doing well or get worse.  Document Released: 02/28/2008 Document Revised: 09/15/2012 Document Reviewed: 07/23/2012  [employer] Patient Information 2014 [employer]
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
## 2013-07-27 NOTE — ED Notes (Signed)
Pt states he began having pai in the right rib area after coughing real hard

## 2013-07-30 NOTE — ED Provider Notes (Signed)
Medical screening examination/treatment/procedure(s) were performed by non-physician practitioner and as supervising physician I was immediately available for consultation/collaboration.  EKG Interpretation   None        Donnetta HutchingBrian Donnelle Olmeda, MD 07/30/13 1700

## 2013-10-06 ENCOUNTER — Other Ambulatory Visit: Payer: Medicare HMO

## 2013-10-06 DIAGNOSIS — R899 Unspecified abnormal finding in specimens from other organs, systems and tissues: Secondary | ICD-10-CM

## 2013-10-06 LAB — BASIC METABOLIC PANEL
BUN: 7 mg/dL (ref 6–23)
CO2: 25 mEq/L (ref 19–32)
CREATININE: 1.3 mg/dL (ref 0.50–1.35)
Calcium: 9.3 mg/dL (ref 8.4–10.5)
Chloride: 102 mEq/L (ref 96–112)
Glucose, Bld: 88 mg/dL (ref 70–99)
Potassium: 4.2 mEq/L (ref 3.5–5.3)
Sodium: 139 mEq/L (ref 135–145)

## 2013-10-12 ENCOUNTER — Other Ambulatory Visit: Payer: Self-pay | Admitting: Family Medicine

## 2013-10-12 ENCOUNTER — Encounter: Payer: Self-pay | Admitting: *Deleted

## 2013-10-12 NOTE — Telephone Encounter (Signed)
Refill appropriate and filled per protocol. 

## 2014-01-20 ENCOUNTER — Other Ambulatory Visit: Payer: Self-pay | Admitting: Family Medicine

## 2014-01-20 NOTE — Telephone Encounter (Signed)
Refill appropriate and filled per protocol. 

## 2014-01-29 ENCOUNTER — Other Ambulatory Visit: Payer: Self-pay | Admitting: Family Medicine

## 2014-01-30 NOTE — Telephone Encounter (Signed)
Refill appropriate and filled per protocol. 

## 2014-05-01 ENCOUNTER — Encounter (HOSPITAL_COMMUNITY): Payer: Self-pay | Admitting: Emergency Medicine

## 2014-05-01 ENCOUNTER — Emergency Department (HOSPITAL_COMMUNITY)
Admission: EM | Admit: 2014-05-01 | Discharge: 2014-05-01 | Disposition: A | Discharge status: Home / Self Care | Payer: Medicare HMO | Attending: Emergency Medicine | Admitting: Emergency Medicine

## 2014-05-01 DIAGNOSIS — G40909 Epilepsy, unspecified, not intractable, without status epilepticus: Secondary | ICD-10-CM | POA: Diagnosis not present

## 2014-05-01 DIAGNOSIS — G43909 Migraine, unspecified, not intractable, without status migrainosus: Secondary | ICD-10-CM | POA: Insufficient documentation

## 2014-05-01 DIAGNOSIS — Z79899 Other long term (current) drug therapy: Secondary | ICD-10-CM | POA: Diagnosis not present

## 2014-05-01 DIAGNOSIS — Z8739 Personal history of other diseases of the musculoskeletal system and connective tissue: Secondary | ICD-10-CM | POA: Diagnosis not present

## 2014-05-01 DIAGNOSIS — N189 Chronic kidney disease, unspecified: Secondary | ICD-10-CM | POA: Diagnosis not present

## 2014-05-01 DIAGNOSIS — I129 Hypertensive chronic kidney disease with stage 1 through stage 4 chronic kidney disease, or unspecified chronic kidney disease: Secondary | ICD-10-CM | POA: Insufficient documentation

## 2014-05-01 DIAGNOSIS — G8929 Other chronic pain: Secondary | ICD-10-CM

## 2014-05-01 DIAGNOSIS — Z9114 Patient's other noncompliance with medication regimen: Secondary | ICD-10-CM | POA: Insufficient documentation

## 2014-05-01 DIAGNOSIS — R51 Headache: Secondary | ICD-10-CM

## 2014-05-01 DIAGNOSIS — R569 Unspecified convulsions: Secondary | ICD-10-CM

## 2014-05-01 DIAGNOSIS — R519 Headache, unspecified: Secondary | ICD-10-CM

## 2014-05-01 HISTORY — DX: Patient's other noncompliance with medication regimen for other reason: Z91.148

## 2014-05-01 HISTORY — DX: Patient's other noncompliance with medication regimen: Z91.14

## 2014-05-01 LAB — PHENYTOIN LEVEL, TOTAL: Phenytoin Lvl: <2.5 ug/mL — ABNORMAL LOW (ref 10.0–20.0)

## 2014-05-01 LAB — CARBAMAZEPINE LEVEL, TOTAL: Carbamazepine Lvl: 1.4 ug/mL — ABNORMAL LOW (ref 4.0–12.0)

## 2014-05-01 MED ORDER — HYDROCODONE-ACETAMINOPHEN 5-325 MG PO TABS
1.0000 | ORAL_TABLET | Freq: Once | ORAL | Status: AC
  Administered 2014-05-01: 1 via ORAL
  Filled 2014-05-01: qty 1

## 2014-05-01 MED ORDER — CARBAMAZEPINE 200 MG PO TABS
400.0000 mg | ORAL_TABLET | Freq: Once | ORAL | Status: AC
  Administered 2014-05-01: 400 mg via ORAL
  Filled 2014-05-01: qty 2

## 2014-05-01 MED ORDER — SODIUM CHLORIDE 0.9 % IV SOLN
1000.0000 mg | Freq: Once | INTRAVENOUS | Status: AC
  Administered 2014-05-01: 1000 mg via INTRAVENOUS
  Filled 2014-05-01: qty 20

## 2014-05-01 MED ORDER — PHENYTOIN SODIUM 50 MG/ML IJ SOLN
INTRAMUSCULAR | Status: AC
  Filled 2014-05-01: qty 20

## 2014-05-01 NOTE — Discharge Instructions (Signed)
°Emergency Department Resource Guide °1) Find a Doctor and Pay Out of Pocket °Although you won't have to find out who is covered by your insurance plan, it is a good idea to ask around and get recommendations. You will then need to call the office and see if the doctor you have chosen will accept you as a new patient and what types of options they offer for patients who are self-pay. Some doctors offer discounts or will set up payment plans for their patients who do not have insurance, but you will need to ask so you aren't surprised when you get to your appointment. ° °2) Contact Your Local Health Department °Not all health departments have doctors that can see patients for sick visits, but many do, so it is worth a call to see if yours does. If you don't know where your local health department is, you can check in your phone book. The CDC also has a tool to help you locate your state's health department, and many state websites also have listings of all of their local health departments. ° °3) Find a Walk-in Clinic °If your illness is not likely to be very severe or complicated, you may want to try a walk in clinic. These are popping up all over the country in pharmacies, drugstores, and shopping centers. They're usually staffed by nurse practitioners or physician assistants that have been trained to treat common illnesses and complaints. They're usually fairly quick and inexpensive. However, if you have serious medical issues or chronic medical problems, these are probably not your best option. ° °No Primary Care Doctor: °- Call [facility] Connect at  832-8000 - they can help you locate a primary care doctor that  accepts your insurance, provides certain services, etc. °- Physician Referral Service- 1-800-533-3463 ° °Chronic Pain Problems: °Organization         Address  Phone   Notes  °[facility]  (336) 297-2271 Patients need to be referred by their primary care doctor.  ° °Medication  Assistance: °Organization         Address  Phone   Notes  °[place] [facility] [address] °[place], [place] [address] (336) 641-8030 --Must be a resident of [place] °-- Must have NO insurance coverage whatsoever (no Medicaid/ Medicare, etc.) °-- The pt. MUST have a primary care doctor that directs their care regularly and follows them in the community °  °[facility]  (866) 331-1348   °[employer]  (888) 892-1162   ° °Agencies that provide inexpensive medical care: °Organization         Address  Phone   Notes  °[facility]  (336) 832-8035   °[facility]    (336) 832-7272   °[facility] [address] °[place], [place] [address] (336) 832-4777   °[facility] [address], °[place] (336) 271-4999   °[facility]    (336) 373-0678   °[facility]    (336) 272-1050   °[facility] ° [address], [place] Phone:  (336) 832-4444, Fax:  (336) 832-4440 Hours of Operation:  9 am - 6 pm, M-F.  Also accepts Medicaid/Medicare and self-pay.  °[facility] ° [address], [place] Phone: (336) 832-3150, Fax: (336) [phone]. Hours of Operation:  8:30 am - 5:30 pm, M-F.  Also accepts Medicaid and self-pay.  °[facility] [place] [address], [place] Phone: (336) 878-6027   °[facility] [address], [place], [place] (336)723-1848, Ext. 123 Mondays & Thursdays: 7-9 AM.  First 15 patients are seen on a first come, first serve basis. °  ° °Medicaid-accepting [place] Providers: ° °Organization         Address  Phone   Notes  °[facility] [address], [place] (336) 641-2100 Also accepts self-pay patients.  °[facility] [address], [place] ° (336) 856-9996   °[facility] [address], [place]  (336) 288-8857   °[facility] [address] [place] [address], [place] (336) 299-7000   °Veita Bland [address], [place]  ° (336) 373-1557 Only accepts [place] Access Medicaid patients after they have their name applied to their card.  ° °Self-Pay (no insurance) in [place]: ° °Organization         Address  Phone   Notes  °Sickle Cell Patients, [facility] [address], [place] (336) 832-1970   °[facility] [address], [place] (336) 832-4400   °[facility] ° [address], [place] (336) 992-4800   °[facility]/Dr. Osei-Bonsu ° [address] [place] [address], [place] or [address], [place] (336) 841-8500 Phone number for both [place] and [place] locations is the same.  °[facility] [address], [place] (336) 299-0000   °[facility] [address] [place] [address], [place] or [address] (336) 852-7530 °(336) 878-2260   °[facility] [address], [place] (336) 350-1642, phone; (336) 294-5005, fax Sees patients 1st and 3rd Saturday of every month.  Must not qualify for public or private insurance (i.e. Medicaid, Medicare, [place] Health Choice, Veterans' Benefits) • Household income should be no more than 200% of the poverty level •The clinic cannot treat you if you are pregnant or think you are pregnant • Sexually transmitted diseases are not treated at the clinic.  ° ° °Dental Care: °Organization         Address  Phone  Notes  °[place] [facility] [address], [place] (336) 641-6152 Accepts children up to age 21 who are enrolled in Medicaid or [place] Health Choice; pregnant women with a Medicaid card; and children who have applied for Medicaid or [place] Health Choice, but were declined, whose parents can pay a reduced fee at time of service.  °[place]  [facility] [place]  [address], [place] (336) 641-7733 Accepts children up to age 21 who are enrolled in Medicaid or [place] Health Choice; pregnant women with a Medicaid card; and children who have applied for Medicaid or [place] Health Choice, but were declined, whose parents can pay a reduced fee at time of service.  °[facility] ° [address], [place] (336) 641-4533 Patients are seen by appointment only. Walk-ins are not accepted. [facility] will see patients 18 years of age and older. °Monday - Tuesday (8am-5pm) °Most Wednesdays (8:30-5pm) °$30 per visit, cash only  °[facility] ° [address], [place] (336) 641-4533 Patients are seen by appointment only. Walk-ins are not accepted. [facility] will see patients 18 years of age and older. °One   Wednesday Evening (Monthly: Volunteer Based).  $30 per visit, cash only  °[facility]  (919) 537-3737 for adults; Children under age 4, call Graduate Pediatric Dentistry at ([phone]) [phone]. Children aged 4-14, please call (919) [phone] to request a pediatric application. ° Dental services are provided in all areas of dental care including fillings, crowns and bridges, complete and partial dentures, implants, gum treatment, root canals, and extractions. Preventive care is also provided. Treatment is provided to both adults and children. °Patients are selected via a lottery and there is often a waiting list. °  °[facility] [address], °[place] ° (336) 763-8833 [email or web address] °  °[facility] [address], [place], [place] (336)723-1848, Ext. 123 Second and Fourth Thursday of each month, opens at 6:30 AM; Clinic ends at 9 AM.  Patients are seen on a first-come first-served basis, and a limited number are seen during each clinic.  ° °[facility] ° [address], [place], [place] (336) 723-7904    Eligibility Requirements °You must have lived in [place], [place], or [place] counties for at least the last three months. °  You cannot be eligible for state or federal sponsored healthcare insurance, including Veterans Administration, Medicaid, or Medicare. °  You generally cannot be eligible for healthcare insurance through your employer.  °  How to apply: °Eligibility screenings are held every Tuesday and Wednesday afternoon from 1:00 pm until 4:00 pm. You do not need an appointment for the interview!  °[facility] [address], [place], [place] 336-631-2330   °[facility]  336-342-8273   °[facility]  336-703-3100   °[facility]  336-570-6415   ° °Behavioral Health Resources in the Community: °Intensive Outpatient Programs °Organization         Address  Phone  Notes  °[facility] [address], [place], [place] 336-878-6098   °[facility] [address], [place], [place] 336-832-9800   °[facility] [address], [place], [place] ° 336-882-2125   °[facility] [address],  °[place], [place] 1-800-853-5163 or 336-641-4981   °Substance Abuse Resources °Organization         Address  Phone  Notes  °[facility]  336-882-2125   °[facility]  336-784-9470   °[facility]  336-285-9073   °[facility]  336-845-3988   °Residential & Outpatient Substance Abuse Program  1-800-659-3381   °Psychological Services °Organization         Address  Phone  Notes  °[facility]  336- 832-9600   °[facility]  336- 378-7881   °[facility] [address], [place] 1-800-853-5163 or 336-641-4981   ° °Mobile Crisis Teams °Organization         Address  Phone  Notes  °[facility], Mobile Crisis Care Unit  1-877-626-1772   °Assertive °[facility] ° [address]  [place], [place] 336-834-9664   °Sharon DeEsch [address] °[place] 336-554-5454   ° °Self-Help/Support Groups °Organization         Address  Phone             Notes  °[facility] of  - variety of support groups  336- 373-1402 Call for more information  °Narcotics Anonymous (NA), [facility] [address], °[place]  2 meetings at this location  ° °  Residential Treatment Programs Organization         Address  Phone  Notes  [facility] [address],    [place]  [phone]   [facility]  [address], [place] [phone], [place], [place] [phone]   [facility] [address], [place] [phone] Admissions: 8am-3pm M-F  [facility] [address]    [place], [place] [phone](301)[phone]   [facility] [address], [place], [place] [phone]   [facility] [address]  [place], [place] [phone]   [facility] Intensive Outpatient [address], [place], [place] [phone]   [facility] ([facility] [address]  [place], [place] [phone] or [phone]   [facility] ([facility]) [address] [place], [place] [phone] Accepts Medicaid  [facility] [address]  [place] [phone] Substance Abuse/Addiction Treatment   [facility] Resources Organization         Address  Phone  Notes  [facility]  805-531-3950(888) 519-726-2533   Angie FavaJulie Brannon, PhD [address], [address] [place], [place]   [phone](336) [phone] or [phone](336) [phone]   [facility]   [address] [place], [place] [phone](336) [phone]   [facility] [address], [place], [place] [phone](336) [phone] Insurance/Medicaid/sponsorship through [facility]  [facility] [address]                                    [place], [place] [phone](336) [phone] Therapy/tele-psych/case    [facility] [address]   [place], [place] [phone](336) [phone]    Dr. Lolly MustacheArfeen  [phone](336) [phone]   [facility] [place]  [employer] [facility] 1) [address],  2) [address], [place] 3)  [address], [place] [phone](336) [phone](336) [phone]  [phone](336) [phone]   [facility] Child Abuse Hotline 519-145-2953(336) [phone] or (812) [phone](336) [phone] (After Hours)      Take your usual prescriptions as previously directed.  Call your regular medical doctor on Monday to schedule a follow up appointment within the next 2 days. Call the Neurologist to schedule a follow up appointment within the next week.  Return to the Emergency Department immediately sooner if worsening.

## 2014-05-01 NOTE — ED Notes (Signed)
[facility]. Patient takes fentex per wife. Patient did not have today's dose. Arrives confused, post-ictal. + nausea.

## 2014-05-01 NOTE — ED Notes (Signed)
Discharge instructions given, pt demonstrated teach back and verbal understanding. No concerns voiced.  
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
## 2014-05-14 ENCOUNTER — Other Ambulatory Visit: Payer: Self-pay | Admitting: Family Medicine

## 2014-05-14 NOTE — Telephone Encounter (Signed)
Medication refilled per protocol. 

## 2014-05-17 NOTE — Telephone Encounter (Signed)
One month refill sent pending appt to discuss seizure medications

## 2014-05-18 ENCOUNTER — Encounter: Payer: Self-pay | Admitting: Family Medicine

## 2014-05-18 ENCOUNTER — Ambulatory Visit (INDEPENDENT_AMBULATORY_CARE_PROVIDER_SITE_OTHER): Payer: Medicare HMO | Admitting: Family Medicine

## 2014-05-18 VITALS — BP 124/86 | HR 84 | Temp 98.2°F | Resp 18 | Wt 164.0 lb

## 2014-05-18 DIAGNOSIS — K921 Melena: Secondary | ICD-10-CM

## 2014-05-18 DIAGNOSIS — G40909 Epilepsy, unspecified, not intractable, without status epilepticus: Secondary | ICD-10-CM
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
## 2014-05-19 ENCOUNTER — Encounter: Payer: Self-pay | Admitting: Internal Medicine

## 2014-05-19 LAB — COMPLETE METABOLIC PANEL WITH GFR
ALBUMIN: 4.6 g/dL (ref 3.5–5.2)
ALK PHOS: 98 U/L (ref 39–117)
ALT: 16 U/L (ref 0–53)
AST: 19 U/L (ref 0–37)
BUN: 11 mg/dL (ref 6–23)
CO2: 29 mEq/L (ref 19–32)
Calcium: 9.9 mg/dL (ref 8.4–10.5)
Chloride: 98 mEq/L (ref 96–112)
Creat: 1.21 mg/dL (ref 0.50–1.35)
GFR, Est African American: 86 mL/min
GFR, Est Non African American: 74 mL/min
Glucose, Bld: 88 mg/dL (ref 70–99)
POTASSIUM: 3.7 meq/L (ref 3.5–5.3)
SODIUM: 141 meq/L (ref 135–145)
TOTAL PROTEIN: 7.4 g/dL (ref 6.0–8.3)
Total Bilirubin: 0.5 mg/dL (ref 0.2–1.2)

## 2014-05-19 LAB — CBC WITH DIFFERENTIAL/PLATELET
BASOS ABS: 0 10*3/uL (ref 0.0–0.1)
BASOS PCT: 0 % (ref 0–1)
Eosinophils Absolute: 0.1 10*3/uL (ref 0.0–0.7)
Eosinophils Relative: 1 % (ref 0–5)
HEMATOCRIT: 43.1 % (ref 39.0–52.0)
Hemoglobin: 15.5 g/dL (ref 13.0–17.0)
Lymphocytes Relative: 29 % (ref 12–46)
Lymphs Abs: 2.2 10*3/uL (ref 0.7–4.0)
MCH: 35 pg — ABNORMAL HIGH (ref 26.0–34.0)
MCHC: 36 g/dL (ref 30.0–36.0)
MCV: 97.3 fL (ref 78.0–100.0)
MPV: 11.1 fL (ref 9.4–12.4)
Monocytes Absolute: 0.7 10*3/uL (ref 0.1–1.0)
Monocytes Relative: 9 % (ref 3–12)
NEUTROS ABS: 4.6 10*3/uL (ref 1.7–7.7)
Neutrophils Relative %: 61 % (ref 43–77)
PLATELETS: 241 10*3/uL (ref 150–400)
RBC: 4.43 MIL/uL (ref 4.22–5.81)
RDW: 12.8 % (ref 11.5–15.5)
WBC: 7.6 10*3/uL (ref 4.0–10.5)

## 2014-05-19 LAB — CARBAMAZEPINE LEVEL, TOTAL

## 2014-05-21 LAB — PHENYTOIN LEVEL, FREE AND TOTAL
Phenytoin, Free: <0.5 mg/L — ABNORMAL LOW (ref 1.0–2.0)
Phenytoin, Total: 3.3 mg/L — ABNORMAL LOW (ref 10.0–20.0)

## 2014-06-02 ENCOUNTER — Ambulatory Visit: Payer: Medicare HMO | Admitting: Diagnostic Neuroimaging

## 2014-06-17 ENCOUNTER — Other Ambulatory Visit: Payer: Self-pay | Admitting: Family Medicine

## 2014-06-17 NOTE — Telephone Encounter (Signed)
Medication refilled per protocol. 

## 2014-06-23 ENCOUNTER — Ambulatory Visit: Payer: Medicare HMO | Admitting: Nurse Practitioner

## 2014-07-09 ENCOUNTER — Ambulatory Visit (INDEPENDENT_AMBULATORY_CARE_PROVIDER_SITE_OTHER): Payer: Medicare HMO | Admitting: Diagnostic Neuroimaging

## 2014-07-09 ENCOUNTER — Other Ambulatory Visit: Payer: Self-pay | Admitting: Diagnostic Neuroimaging

## 2014-07-09 ENCOUNTER — Encounter: Payer: Self-pay | Admitting: Diagnostic Neuroimaging

## 2014-07-09 VITALS — BP 141/87 | HR 76 | Ht 68.0 in | Wt 164.0 lb

## 2014-07-09 DIAGNOSIS — G40109 Localization-related (focal) (partial) symptomatic epilepsy and epileptic syndromes with simple partial seizures, not intractable, without status epilepticus: Secondary | ICD-10-CM

## 2014-07-09 DIAGNOSIS — G060 Intracranial abscess and granuloma: Secondary | ICD-10-CM

## 2014-07-09 MED ORDER — LEVETIRACETAM 500 MG PO TABS: 500.0000 mg | ORAL_TABLET | Freq: Two times a day (BID) | ORAL | Status: DC

## 2014-07-09 NOTE — Patient Instructions (Signed)
Start levetiracetam 500mg  twice a day.  Continue dilantin and carbamazepine.
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
## 2014-07-13 ENCOUNTER — Encounter: Payer: Self-pay | Admitting: Nurse Practitioner

## 2014-07-13 ENCOUNTER — Ambulatory Visit: Payer: Medicare HMO | Admitting: Nurse Practitioner

## 2014-08-03 ENCOUNTER — Ambulatory Visit (INDEPENDENT_AMBULATORY_CARE_PROVIDER_SITE_OTHER): Payer: Medicare HMO | Admitting: Nurse Practitioner

## 2014-08-03 ENCOUNTER — Other Ambulatory Visit: Payer: Self-pay

## 2014-08-03 ENCOUNTER — Other Ambulatory Visit: Payer: Self-pay | Admitting: Nurse Practitioner

## 2014-08-03 ENCOUNTER — Encounter: Payer: Self-pay | Admitting: Nurse Practitioner

## 2014-08-03 VITALS — BP 136/85 | HR 80 | Temp 97.6°F | Ht 68.0 in | Wt 164.2 lb

## 2014-08-03 DIAGNOSIS — K219 Gastro-esophageal reflux disease without esophagitis: Secondary | ICD-10-CM

## 2014-08-03 DIAGNOSIS — K921 Melena: Secondary | ICD-10-CM

## 2014-08-03 MED ORDER — PEG 3350-KCL-NA BICARB-NACL 420 G PO SOLR: 4000.0000 mL | ORAL | Status: DC

## 2014-08-03 MED ORDER — OMEPRAZOLE 20 MG PO CPDR: 20.0000 mg | DELAYED_RELEASE_CAPSULE | Freq: Every day | ORAL | Status: DC

## 2014-08-03 NOTE — Patient Instructions (Signed)
1. We will schedule you for your procedure (colonoscopy) 2. Make sure you keep taking your seizure medications as ordered (take them the morning of the procedure, if your normally take them in the morning, with small sips of water) 3. Hold off on the Zantac for right now 4. Start Omeprazole 20 mg daily. TUMS as needed, call if you're taking them more frequently then once-twice a week. 5. Follow-up with us in 6-8 weeks.
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
## 2014-08-06 ENCOUNTER — Other Ambulatory Visit: Payer: Self-pay | Admitting: Family Medicine

## 2014-08-06 NOTE — Telephone Encounter (Signed)
Medication refilled per protocol. 

## 2014-08-06 NOTE — Assessment & Plan Note (Addendum)
Marginal control of dyspepsia/GERD symptoms with daily Zantac and TUMS. Hold Zantac and start omeprazole PPI for better control. Interactions verified with PPI and seizure medications. Return for eval in 6-8 weeks, can increase PPI and/or add back H2 blocker if remains uncontrolled. Recommended avoiding NSAIDs and ASA powders.

## 2014-08-06 NOTE — Assessment & Plan Note (Signed)
Subjective hematochezia with passage of clot-like substance for "several months" which occurs a few times a month. Also with some lower abdominal pain 1-2 times a week without N/V not relieved by defecation. Recent CBC normal for H/H. No other red flag/warning signs. DRE with PCP heme-negative. Does have a seizure history with questionable compliance per the PCP. He has subsequently been referred to neurology and a new seizure regimen started and has not had any seizures with the new medication regimen (about 1 month ago). Has never had a colonoscopy. Likely benign anorectal source but cannot rule out malignancy or other more occult process. Will plan for colonoscopy to evaluate further. Discussed procedure with Dr. Jena Gaussourk in light of seizure medications and possible change in seizure threshold and deemed safe on new medications with the sedation typically used in endoscopy. Will plan for in office follow-up in 6-8 weeks regardless.  Proceed with TCS with Dr. Jena Gaussourk in near future: the risks, benefits, and alternatives have been discussed with the patient in detail. The patient states understanding and desires to proceed.  Discussed with patient the importance of taking his regular seizure medications the day of the procedure with sips of water. He verbalized understanding.

## 2014-08-09 NOTE — Progress Notes (Signed)
cc'ed to pcp °

## 2014-08-14 ENCOUNTER — Other Ambulatory Visit: Payer: Self-pay | Admitting: Family Medicine

## 2014-08-23 ENCOUNTER — Encounter (HOSPITAL_COMMUNITY)
Admission: RE | Disposition: A | Discharge status: Home / Self Care | Payer: Self-pay | Source: Ambulatory Visit | Attending: Internal Medicine

## 2014-08-23 ENCOUNTER — Ambulatory Visit (HOSPITAL_COMMUNITY)
Admission: RE | Admit: 2014-08-23 | Discharge: 2014-08-23 | Disposition: A | Discharge status: Home / Self Care | Payer: Medicare HMO | Source: Ambulatory Visit | Attending: Internal Medicine | Admitting: Internal Medicine

## 2014-08-23 ENCOUNTER — Encounter (HOSPITAL_COMMUNITY): Payer: Self-pay | Admitting: *Deleted

## 2014-08-23 DIAGNOSIS — I129 Hypertensive chronic kidney disease with stage 1 through stage 4 chronic kidney disease, or unspecified chronic kidney disease: Secondary | ICD-10-CM | POA: Insufficient documentation

## 2014-08-23 DIAGNOSIS — G43909 Migraine, unspecified, not intractable, without status migrainosus: Secondary | ICD-10-CM | POA: Diagnosis not present

## 2014-08-23 DIAGNOSIS — G40909 Epilepsy, unspecified, not intractable, without status epilepticus: Secondary | ICD-10-CM | POA: Insufficient documentation

## 2014-08-23 DIAGNOSIS — K648 Other hemorrhoids: Secondary | ICD-10-CM | POA: Insufficient documentation

## 2014-08-23 DIAGNOSIS — M419 Scoliosis, unspecified: Secondary | ICD-10-CM | POA: Insufficient documentation

## 2014-08-23 DIAGNOSIS — K921 Melena: Secondary | ICD-10-CM

## 2014-08-23 DIAGNOSIS — Z9049 Acquired absence of other specified parts of digestive tract: Secondary | ICD-10-CM | POA: Diagnosis not present

## 2014-08-23 DIAGNOSIS — N189 Chronic kidney disease, unspecified: Secondary | ICD-10-CM | POA: Insufficient documentation

## 2014-08-23 DIAGNOSIS — Z7982 Long term (current) use of aspirin: Secondary | ICD-10-CM | POA: Insufficient documentation

## 2014-08-23 DIAGNOSIS — Z9114 Patient's other noncompliance with medication regimen: Secondary | ICD-10-CM | POA: Diagnosis not present

## 2014-08-23 HISTORY — PX: COLONOSCOPY: SHX5424

## 2014-08-23 SURGERY — COLONOSCOPY
Anesthesia: Moderate Sedation

## 2014-08-23 MED ORDER — MIDAZOLAM HCL 5 MG/5ML IJ SOLN
INTRAMUSCULAR | Status: AC
  Filled 2014-08-23: qty 10

## 2014-08-23 MED ORDER — MEPERIDINE HCL 100 MG/ML IJ SOLN
INTRAMUSCULAR | Status: AC
  Filled 2014-08-23: qty 2

## 2014-08-23 MED ORDER — ONDANSETRON HCL 4 MG/2ML IJ SOLN
INTRAMUSCULAR | Status: DC | PRN
  Administered 2014-08-23: 4 mg via INTRAVENOUS

## 2014-08-23 MED ORDER — STERILE WATER FOR IRRIGATION IR SOLN
Status: DC | PRN
  Administered 2014-08-23: 09:00:00

## 2014-08-23 MED ORDER — SODIUM CHLORIDE 0.9 % IV SOLN
INTRAVENOUS | Status: DC
  Administered 2014-08-23: 09:00:00 via INTRAVENOUS

## 2014-08-23 MED ORDER — MEPERIDINE HCL 100 MG/ML IJ SOLN
INTRAMUSCULAR | Status: DC | PRN
  Administered 2014-08-23: 25 mg via INTRAVENOUS
  Administered 2014-08-23: 50 mg via INTRAVENOUS
  Administered 2014-08-23: 25 mg via INTRAVENOUS

## 2014-08-23 MED ORDER — MIDAZOLAM HCL 5 MG/5ML IJ SOLN
INTRAMUSCULAR | Status: DC | PRN
  Administered 2014-08-23 (×3): 1 mg via INTRAVENOUS
  Administered 2014-08-23: 2 mg via INTRAVENOUS

## 2014-08-23 MED ORDER — ONDANSETRON HCL 4 MG/2ML IJ SOLN
INTRAMUSCULAR | Status: AC
  Filled 2014-08-23: qty 2

## 2014-08-23 NOTE — Discharge Instructions (Addendum)
Colonoscopy Discharge Instructions  Read the instructions outlined below and refer to this sheet in the next few weeks. These discharge instructions provide you with general information on caring for yourself after you leave the hospital. Your doctor may also give you specific instructions. While your treatment has been planned according to the most current medical practices available, unavoidable complications occasionally occur. If you have any problems or questions after discharge, call Dr. Jena Gaussourk at [phone]. ACTIVITY  You may resume your regular activity, but move at a slower pace for the next 24 hours.   Take frequent rest periods for the next 24 hours.   Walking will help get rid of the air and reduce the bloated feeling in your belly (abdomen).   No driving for 24 hours (because of the medicine (anesthesia) used during the test).    Do not sign any important legal documents or operate any machinery for 24 hours (because of the anesthesia used during the test).  NUTRITION  Drink plenty of fluids.   You may resume your normal diet as instructed by your doctor.   Begin with a light meal and progress to your normal diet. Heavy or fried foods are harder to digest and may make you feel sick to your stomach (nauseated).   Avoid alcoholic beverages for 24 hours or as instructed.  MEDICATIONS  You may resume your normal medications unless your doctor tells you otherwise.  WHAT YOU CAN EXPECT TODAY  Some feelings of bloating in the abdomen.   Passage of more gas than usual.   Spotting of blood in your stool or on the toilet paper.  IF YOU HAD POLYPS REMOVED DURING THE COLONOSCOPY:  No aspirin products for 7 days or as instructed.   No alcohol for 7 days or as instructed.   Eat a soft diet for the next 24 hours.  FINDING OUT THE RESULTS OF YOUR TEST Not all test results are available during your visit. If your test results are not back during the visit, make an appointment  with your caregiver to find out the results. Do not assume everything is normal if you have not heard from your caregiver or the medical facility. It is important for you to follow up on all of your test results.  SEEK IMMEDIATE MEDICAL ATTENTION IF:  You have more than a spotting of blood in your stool.   Your belly is swollen (abdominal distention).   You are nauseated or vomiting.   You have a temperature over 101.   You have abdominal pain or discomfort that is severe or gets worse throughout the day.    Hemorrhoid information provided  Trial of Recticare cream applied to the ano-rectum 3 times a day as needed  Add Benefiber 2 teaspoons twice daily  Avoid constipation. Hemorrhoids Hemorrhoids are swollen veins around the rectum or anus. There are two types of hemorrhoids:   Internal hemorrhoids. These occur in the veins just inside the rectum. They may poke through to the outside and become irritated and painful.  External hemorrhoids. These occur in the veins outside the anus and can be felt as a painful swelling or hard lump near the anus. CAUSES  Pregnancy.   Obesity.   Constipation or diarrhea.   Straining to have a bowel movement.   Sitting for long periods on the toilet.  Heavy lifting or other activity that caused you to strain.  Anal intercourse. SYMPTOMS   Pain.   Anal itching or irritation.   Rectal bleeding.  Fecal leakage.   Anal swelling.   One or more lumps around the anus.  DIAGNOSIS  Your caregiver may be able to diagnose hemorrhoids by visual examination. Other examinations or tests that may be performed include:   Examination of the rectal area with a gloved hand (digital rectal exam).   Examination of anal canal using a small tube (scope).   A blood test if you have lost a significant amount of blood.  A test to look inside the colon (sigmoidoscopy or colonoscopy). TREATMENT Most hemorrhoids can be treated at home.  However, if symptoms do not seem to be getting better or if you have a lot of rectal bleeding, your caregiver may perform a procedure to help make the hemorrhoids get smaller or remove them completely. Possible treatments include:   Placing a rubber band at the base of the hemorrhoid to cut off the circulation (rubber band ligation).   Injecting a chemical to shrink the hemorrhoid (sclerotherapy).   Using a tool to burn the hemorrhoid (infrared light therapy).   Surgically removing the hemorrhoid (hemorrhoidectomy).   Stapling the hemorrhoid to block blood flow to the tissue (hemorrhoid stapling).  HOME CARE INSTRUCTIONS   Eat foods with fiber, such as whole grains, beans, nuts, fruits, and vegetables. Ask your doctor about taking products with added fiber in them (fibersupplements).  Increase fluid intake. Drink enough water and fluids to keep your urine clear or pale yellow.   Exercise regularly.   Go to the bathroom when you have the urge to have a bowel movement. Do not wait.   Avoid straining to have bowel movements.   Keep the anal area dry and clean. Use wet toilet paper or moist towelettes after a bowel movement.   Medicated creams and suppositories may be used or applied as directed.   Only take over-the-counter or prescription medicines as directed by your caregiver.   Take warm sitz baths for 15-20 minutes, 3-4 times a day to ease pain and discomfort.   Place ice packs on the hemorrhoids if they are tender and swollen. Using ice packs between sitz baths may be helpful.   Put ice in a plastic bag.   Place a towel between your skin and the bag.   Leave the ice on for 15-20 minutes, 3-4 times a day.   Do not use a donut-shaped pillow or sit on the toilet for long periods. This increases blood pooling and pain.  SEEK MEDICAL CARE IF:  You have increasing pain and swelling that is not controlled by treatment or medicine.  You have uncontrolled  bleeding.  You have difficulty or you are unable to have a bowel movement.  You have pain or inflammation outside the area of the hemorrhoids. MAKE SURE YOU:  Understand these instructions.  Will watch your condition.  Will get help right away if you are not doing well or get worse. Document Released: 05/18/2000 Document Revised: 05/07/2012 Document Reviewed: 03/25/2012 [facility] Patient Information 2015 [place], [place]. This information is not intended to replace advice given to you by your health care provider. Make sure you discuss any questions you have with your health care provider.

## 2014-08-23 NOTE — Op Note (Signed)
[facility] [address] [place], [id number]   COLONOSCOPY PROCEDURE REPORT  PATIENT: [name], Alexander Collier  MR#: [id number] BIRTHDATE: 1976-03-08 , 39  yrs. old GENDER: male ENDOSCOPIST: R.  Roetta SessionsMichael Lucinda Spells, MD FACP [facility] REFERRED NF:AOZHYQBY:Warren Tanya NonesPickard, M.Collier. PROCEDURE DATE:  08/23/2014 PROCEDURE:   Colonoscopy, diagnostic INDICATIONS:paper hematochezia. MEDICATIONS: Versed 5 mg IV and Demerol 125 mg IV in divided doses. Zofran 4 mg IV. ASA CLASS:       Class II  CONSENT: The risks, benefits, alternatives and imponderables including but not limited to bleeding, perforation as well as the possibility of a missed lesion have been reviewed.  The potential for biopsy, lesion removal, etc. have also been discussed. Questions have been answered.  All parties agreeable.  Please see the history and physical in the medical record for more information.  DESCRIPTION OF PROCEDURE:   After the risks benefits and alternatives of the procedure were thoroughly explained, informed consent was obtained.  The digital rectal exam revealed no abnormalities of the rectum.   The     endoscope was introduced through the anus and advanced to the cecum, which was identified by both the appendix and ileocecal valve. No adverse events experienced.   The quality of the prep was adequate  The instrument was then slowly withdrawn as the colon was fully examined.      COLON FINDINGS: Minimal internal hemorrhoids; otherwise, normal rectum.  Normal-appearing colonic mucosa.  Retroflexion was performed. .  Withdrawal time=7 minutes 0 seconds.  The scope was withdrawn and the procedure completed. COMPLICATIONS: There were no immediate complications.  ENDOSCOPIC IMPRESSION: Minimal anal canal hemorrhoids; otherwise,  normal colonoscopy. I suspect benign anorectal bleeding in the setting of constipation. A small intermittent occult anal fissure also remains  a possibility.  RECOMMENDATIONS: Benefiber 2 teaspoons twice daily to regimen. Course of Recticare - applied to the anorectum 3 times daily as needed  eSigned:  R. Roetta SessionsMichael Nahiem Dredge, MD Jerrel IvoryFACP [facility] 08/23/2014 9:47 AM   cc:  CPT CODES: ICD CODES:  The ICD and CPT codes recommended by this software are interpretations from the data that the clinical staff has captured with the software.  The verification of the translation of this report to the ICD and CPT codes and modifiers is the sole responsibility of the health care institution and practicing physician where this report was generated.  [employer] will not be held responsible for the validity of the ICD and CPT codes included on this report.  AMA assumes no liability for data contained or not contained herein. CPT is a Publishing rights managerregistered trademark of the [employer].

## 2014-08-23 NOTE — Interval H&P Note (Signed)
History and Physical Interval Note:  08/23/2014 9:12 AM  Alexander Collier  has presented today for surgery, with the diagnosis of HEMATOCHEZIA  The various methods of treatment have been discussed with the patient and family. After consideration of risks, benefits and other options for treatment, the patient has consented to  Procedure(s) with comments: COLONOSCOPY (N/A) - 915  as a surgical intervention .  The patient's history has been reviewed, patient examined, no change in status, stable for surgery.  I have reviewed the patient's chart and labs.  Questions were answered to the patient's satisfaction.     Alexander Collier  No change. Colonoscopy per plan.  The risks, benefits, limitations, alternatives and imponderables have been reviewed with the patient. Questions have been answered. All parties are agreeable.
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
## 2014-08-25 ENCOUNTER — Encounter (HOSPITAL_COMMUNITY): Payer: Self-pay | Admitting: Internal Medicine

## 2014-09-17 ENCOUNTER — Encounter: Payer: Self-pay | Admitting: *Deleted

## 2014-10-18 ENCOUNTER — Telehealth: Payer: Self-pay | Admitting: *Deleted

## 2014-10-18 NOTE — Telephone Encounter (Signed)
Spoke to the pts mother and was able to get his follow up appt changed to Friday June 17 at 1130 am. i thanked her

## 2014-10-22 ENCOUNTER — Other Ambulatory Visit: Payer: Self-pay | Admitting: Family Medicine

## 2014-10-29 ENCOUNTER — Telehealth: Payer: Self-pay | Admitting: Family Medicine

## 2014-10-29 ENCOUNTER — Ambulatory Visit: Payer: Medicare HMO | Admitting: Gastroenterology

## 2014-10-29 ENCOUNTER — Telehealth: Payer: Self-pay | Admitting: Gastroenterology

## 2014-10-29 MED ORDER — PHENYTOIN SODIUM EXTENDED 100 MG PO CAPS: 200.0000 mg | ORAL_CAPSULE | Freq: Two times a day (BID) | ORAL | Status: DC

## 2014-10-29 NOTE — Telephone Encounter (Signed)
Pt was a no show

## 2014-10-29 NOTE — Telephone Encounter (Signed)
Medication called/sent to requested pharmacy  

## 2014-10-29 NOTE — Telephone Encounter (Signed)
Patients mother debra stone calling to say that patient does have an appointment with dr pickard next week, but needs emergent phenytoin if possible called into the [facility] [place]  [phone]

## 2014-11-02 ENCOUNTER — Encounter: Payer: Self-pay | Admitting: Family Medicine

## 2014-11-02 ENCOUNTER — Ambulatory Visit (INDEPENDENT_AMBULATORY_CARE_PROVIDER_SITE_OTHER): Payer: Medicare HMO | Admitting: Family Medicine

## 2014-11-02 VITALS — BP 120/82 | HR 78 | Temp 98.4°F | Resp 16 | Ht 67.0 in | Wt 159.0 lb

## 2014-11-02 DIAGNOSIS — Z79899 Other long term (current) drug therapy: Secondary | ICD-10-CM

## 2014-11-02 DIAGNOSIS — G40909 Epilepsy, unspecified, not intractable, without status epilepticus: Secondary | ICD-10-CM | POA: Diagnosis not present

## 2014-11-02 DIAGNOSIS — I10 Essential (primary) hypertension: Secondary | ICD-10-CM

## 2014-11-02 LAB — CBC WITH DIFFERENTIAL/PLATELET
BASOS PCT: 0 % (ref 0–1)
Basophils Absolute: 0 10*3/uL (ref 0.0–0.1)
EOS PCT: 2 % (ref 0–5)
Eosinophils Absolute: 0.1 10*3/uL (ref 0.0–0.7)
HEMATOCRIT: 46.4 % (ref 39.0–52.0)
HEMOGLOBIN: 16.7 g/dL (ref 13.0–17.0)
Lymphocytes Relative: 31 % (ref 12–46)
Lymphs Abs: 1.9 10*3/uL (ref 0.7–4.0)
MCH: 35.2 pg — ABNORMAL HIGH (ref 26.0–34.0)
MCHC: 36 g/dL (ref 30.0–36.0)
MCV: 97.7 fL (ref 78.0–100.0)
MONO ABS: 0.4 10*3/uL (ref 0.1–1.0)
MPV: 10.8 fL (ref 8.6–12.4)
Monocytes Relative: 7 % (ref 3–12)
Neutro Abs: 3.6 10*3/uL (ref 1.7–7.7)
Neutrophils Relative %: 60 % (ref 43–77)
Platelets: 234 10*3/uL (ref 150–400)
RBC: 4.75 MIL/uL (ref 4.22–5.81)
RDW: 13 % (ref 11.5–15.5)
WBC: 6 10*3/uL (ref 4.0–10.5)

## 2014-11-02 MED ORDER — LOSARTAN POTASSIUM 50 MG PO TABS: 50.0000 mg | ORAL_TABLET | Freq: Every day | ORAL | Status: DC
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
## 2014-11-03 LAB — COMPLETE METABOLIC PANEL WITH GFR
ALT: 14 U/L (ref 0–53)
AST: 16 U/L (ref 0–37)
Albumin: 4.6 g/dL (ref 3.5–5.2)
Alkaline Phosphatase: 100 U/L (ref 39–117)
BUN: 9 mg/dL (ref 6–23)
CHLORIDE: 101 meq/L (ref 96–112)
CO2: 29 mEq/L (ref 19–32)
CREATININE: 1.18 mg/dL (ref 0.50–1.35)
Calcium: 10.1 mg/dL (ref 8.4–10.5)
GFR, Est African American: 88 mL/min
GFR, Est Non African American: 76 mL/min
GLUCOSE: 74 mg/dL (ref 70–99)
POTASSIUM: 4.1 meq/L (ref 3.5–5.3)
Sodium: 140 mEq/L (ref 135–145)
TOTAL PROTEIN: 7.5 g/dL (ref 6.0–8.3)
Total Bilirubin: 0.5 mg/dL (ref 0.2–1.2)

## 2014-11-03 LAB — CARBAMAZEPINE LEVEL, TOTAL: Carbamazepine Lvl: 5.5 ug/mL (ref 4.0–12.0)

## 2014-11-05 LAB — LEVETIRACETAM LEVEL: Keppra (Levetiracetam): 6.5 ug/mL

## 2014-11-06 LAB — PHENYTOIN LEVEL, FREE AND TOTAL
Phenytoin, Free: <0.5 mg/L — ABNORMAL LOW (ref 1.0–2.0)
Phenytoin, Total: 2.9 mg/L — ABNORMAL LOW (ref 10.0–20.0)

## 2014-11-11 ENCOUNTER — Other Ambulatory Visit: Payer: Self-pay | Admitting: Family Medicine

## 2014-11-11 DIAGNOSIS — E785 Hyperlipidemia, unspecified: Secondary | ICD-10-CM

## 2014-11-12 ENCOUNTER — Ambulatory Visit: Payer: Medicare HMO | Admitting: Diagnostic Neuroimaging

## 2014-11-15 ENCOUNTER — Telehealth: Payer: Self-pay | Admitting: Diagnostic Neuroimaging

## 2014-11-15 NOTE — Telephone Encounter (Signed)
Patient's mother is calling in regard to her son's appointment of 6/15 and is cancelling as her 59 year oldmother is very sick and she cannot bring him.  The patient's PCP Dr. Lynnea Ferrier saw him a week ago and the patient's labs showed his Dilantan 100 mg 2 tablets 2 X per day and Levetiracetam 500 mg 1 tablet twice a day levels to be in good range. She feels like the purpose of the appointment was to check these levels and start weining him off.  Could you please call her [phone] and  advise how to proceed as it might be some time before she is able to reschedule.  Thanks!

## 2014-11-16 NOTE — Telephone Encounter (Signed)
Spoke to the pts mom on the phone and explained to her that the point of the appt that was scheduled for tomorrow was for Dr. Marjory Lies to get him off the dilantin. The mom stated an understanding of this and said that due to issues with her mother, they would be unable to get in for a follow up appt until late August. I told her I would let Dr. Demetrius Charity know and that he would need to continue his dilantin and keppra both until he was seen again. She stated an understanding and told me that she would call me back closer to august to get his appt scheduled. She thanked me

## 2014-11-19 ENCOUNTER — Ambulatory Visit: Payer: Self-pay | Admitting: Diagnostic Neuroimaging

## 2015-02-15 ENCOUNTER — Other Ambulatory Visit: Payer: Self-pay | Admitting: Family Medicine

## 2015-02-17 ENCOUNTER — Other Ambulatory Visit: Payer: Self-pay | Admitting: Family Medicine

## 2015-02-17 NOTE — Telephone Encounter (Signed)
Refill appropriate and filled per protocol. 

## 2015-05-23 ENCOUNTER — Other Ambulatory Visit: Payer: Self-pay | Admitting: Family Medicine

## 2015-05-23 NOTE — Telephone Encounter (Signed)
?   Ok to refill  Last ov 11/02/14  Last rf: 02/17/15+

## 2015-05-23 NOTE — Telephone Encounter (Signed)
ok 

## 2015-05-24 NOTE — Telephone Encounter (Signed)
Medication refilled per protocol. 

## 2015-05-25 ENCOUNTER — Other Ambulatory Visit: Payer: Self-pay | Admitting: Family Medicine

## 2015-07-09 ENCOUNTER — Other Ambulatory Visit: Payer: Self-pay | Admitting: Family Medicine

## 2015-07-09 ENCOUNTER — Other Ambulatory Visit: Payer: Self-pay | Admitting: Diagnostic Neuroimaging

## 2015-07-11 NOTE — Telephone Encounter (Signed)
ok 

## 2015-07-11 NOTE — Telephone Encounter (Signed)
Ok to refill 

## 2015-07-11 NOTE — Telephone Encounter (Signed)
Prescription sent to pharmacy.

## 2015-07-11 NOTE — Telephone Encounter (Signed)
Called home phone, no answer, unable to LVM.  Called aunt, Amelia Jo and LVM requesting call back. Informed her that a 30 day supply of medication for patient's seizures can be prescribed , but in order for Dr Marjory Lies to continue to prescribe medication, patient needs to be seen in FU. Patient seen once 07/2014.  Left this caller's name, number.

## 2015-07-20 ENCOUNTER — Telehealth: Payer: Self-pay | Admitting: Family Medicine

## 2015-07-20 NOTE — Telephone Encounter (Signed)
Pt is coming in for labs and she wanting to know if he needed to stop any of his medications? I informed her for him to take his medication as normal.

## 2015-07-20 NOTE — Telephone Encounter (Signed)
Mother debra stone is on patients hippa to speak with and is calling to speak with you regarding his upcoming appt and a prescription question  [phone]

## 2015-07-22 ENCOUNTER — Encounter: Payer: Self-pay | Admitting: Family Medicine

## 2015-07-22 ENCOUNTER — Ambulatory Visit (INDEPENDENT_AMBULATORY_CARE_PROVIDER_SITE_OTHER): Payer: Medicare HMO | Admitting: Family Medicine

## 2015-07-22 ENCOUNTER — Ambulatory Visit (HOSPITAL_COMMUNITY)
Admission: RE | Admit: 2015-07-22 | Discharge: 2015-07-22 | Disposition: A | Discharge status: Home / Self Care | Payer: Medicare HMO | Source: Ambulatory Visit | Attending: Family Medicine | Admitting: Family Medicine

## 2015-07-22 VITALS — BP 110/98 | HR 72 | Temp 98.2°F | Resp 16 | Ht 67.0 in | Wt 165.0 lb

## 2015-07-22 DIAGNOSIS — R05 Cough: Secondary | ICD-10-CM | POA: Diagnosis not present

## 2015-07-22 DIAGNOSIS — R079 Chest pain, unspecified: Secondary | ICD-10-CM | POA: Insufficient documentation

## 2015-07-22 DIAGNOSIS — G40909 Epilepsy, unspecified, not intractable, without status epilepticus: Secondary | ICD-10-CM

## 2015-07-22 LAB — COMPLETE METABOLIC PANEL WITH GFR
ALT: 12 U/L (ref 9–46)
AST: 16 U/L (ref 10–40)
Albumin: 4.8 g/dL (ref 3.6–5.1)
Alkaline Phosphatase: 94 U/L (ref 40–115)
BUN: 12 mg/dL (ref 7–25)
CHLORIDE: 97 mmol/L — AB (ref 98–110)
CO2: 31 mmol/L (ref 20–31)
Calcium: 10 mg/dL (ref 8.6–10.3)
Creat: 1.41 mg/dL — ABNORMAL HIGH (ref 0.60–1.35)
GFR, Est African American: 71 mL/min (ref >=60)
GFR, Est Non African American: 61 mL/min (ref >=60)
Glucose, Bld: 83 mg/dL (ref 70–99)
Potassium: 4.2 mmol/L (ref 3.5–5.3)
Sodium: 137 mmol/L (ref 135–146)
TOTAL PROTEIN: 7.9 g/dL (ref 6.1–8.1)
Total Bilirubin: 0.6 mg/dL (ref 0.2–1.2)

## 2015-07-22 LAB — CBC WITH DIFFERENTIAL/PLATELET
Basophils Absolute: 0 10*3/uL (ref 0.0–0.1)
Basophils Relative: 0 % (ref 0–1)
EOS ABS: 0.1 10*3/uL (ref 0.0–0.7)
EOS PCT: 1 % (ref 0–5)
HEMATOCRIT: 47.5 % (ref 39.0–52.0)
Hemoglobin: 16.7 g/dL (ref 13.0–17.0)
LYMPHS PCT: 33 % (ref 12–46)
Lymphs Abs: 1.8 10*3/uL (ref 0.7–4.0)
MCH: 34.9 pg — ABNORMAL HIGH (ref 26.0–34.0)
MCHC: 35.2 g/dL (ref 30.0–36.0)
MCV: 99.2 fL (ref 78.0–100.0)
MPV: 10.6 fL (ref 8.6–12.4)
Monocytes Absolute: 0.5 10*3/uL (ref 0.1–1.0)
Monocytes Relative: 9 % (ref 3–12)
Neutro Abs: 3.1 10*3/uL (ref 1.7–7.7)
Neutrophils Relative %: 57 % (ref 43–77)
PLATELETS: 201 10*3/uL (ref 150–400)
RBC: 4.79 MIL/uL (ref 4.22–5.81)
RDW: 13 % (ref 11.5–15.5)
WBC: 5.4 10*3/uL (ref 4.0–10.5)

## 2015-07-22 LAB — LIPID PANEL
CHOL/HDL RATIO: 5.8 ratio — AB (ref <=5.0)
Cholesterol: 161 mg/dL (ref 125–200)
HDL: 28 mg/dL — ABNORMAL LOW (ref >=40)
Triglycerides: 493 mg/dL — ABNORMAL HIGH (ref <150)
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
## 2015-07-25 ENCOUNTER — Other Ambulatory Visit: Payer: Self-pay | Admitting: Family Medicine

## 2015-07-25 DIAGNOSIS — I10 Essential (primary) hypertension: Secondary | ICD-10-CM

## 2015-07-25 DIAGNOSIS — E785 Hyperlipidemia, unspecified: Secondary | ICD-10-CM

## 2015-07-25 DIAGNOSIS — R944 Abnormal results of kidney function studies: Secondary | ICD-10-CM

## 2015-07-25 DIAGNOSIS — Z79899 Other long term (current) drug therapy: Secondary | ICD-10-CM

## 2015-07-25 LAB — LEVETIRACETAM LEVEL: Keppra (Levetiracetam): 24 ug/mL

## 2015-07-25 MED ORDER — FENOFIBRATE 160 MG PO TABS: 160.0000 mg | ORAL_TABLET | Freq: Every day | ORAL | Status: DC

## 2015-07-26 LAB — CARBAMAZEPINE, FREE AND TOTAL
CARBAMAZEPINE, BOUND: 3.6 ug/mL
Carbamazepine Metabolite -: 0.7 ug/mL (ref 0.2–2.0)
Carbamazepine Metabolite/: 0.3 ug/mL
Carbamazepine Metabolite: 0.4 ug/mL (ref 0.1–1.0)
Carbamazepine, Free: 0.7 ug/mL — ABNORMAL LOW (ref 1.0–3.0)
Carbamazepine, Total: 4.3 ug/mL (ref 4.0–12.0)

## 2015-07-27 ENCOUNTER — Encounter: Payer: Self-pay | Admitting: Diagnostic Neuroimaging

## 2015-07-27 ENCOUNTER — Ambulatory Visit (INDEPENDENT_AMBULATORY_CARE_PROVIDER_SITE_OTHER): Payer: Medicare HMO | Admitting: Diagnostic Neuroimaging

## 2015-07-27 VITALS — BP 121/84 | HR 74 | Ht 67.0 in | Wt 163.4 lb

## 2015-07-27 DIAGNOSIS — G060 Intracranial abscess and granuloma: Secondary | ICD-10-CM | POA: Diagnosis not present

## 2015-07-27 DIAGNOSIS — G40109 Localization-related (focal) (partial) symptomatic epilepsy and epileptic syndromes with simple partial seizures, not intractable, without status epilepticus: Secondary | ICD-10-CM | POA: Diagnosis not present

## 2015-07-27 MED ORDER — LEVETIRACETAM 500 MG PO TABS: 1000.0000 mg | ORAL_TABLET | Freq: Two times a day (BID) | ORAL | Status: DC

## 2015-07-27 MED ORDER — PHENYTOIN SODIUM EXTENDED 100 MG PO CAPS: 100.0000 mg | ORAL_CAPSULE | Freq: Two times a day (BID) | ORAL | Status: DC

## 2015-07-27 MED ORDER — CARBAMAZEPINE 200 MG PO TABS: 200.0000 mg | ORAL_TABLET | Freq: Two times a day (BID) | ORAL | Status: DC
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
## 2015-07-27 NOTE — Patient Instructions (Signed)
Thank you for coming to see [place] at [facility]. I hope we have been able to provide you high quality care today.  You may receive a patient satisfaction survey over the next few weeks. We would appreciate your feedback and comments so that we may continue to improve ourselves and the health of our patients.  - increase levetiracetam to 1095m twice a day - reduce phenytoin to 1048mtwice a day - continue carbamazepine 2007mwice a day   ~~~~~~~~~~~~~~~~~~~~~~~~~~~~~~~~~~~~~~~~~~~~~~~~~~~~~~~~~~~~~~~~~  DR. Vinisha Faxon'S GUIDE TO HAPPY AND HEALTHY LIVING These are some of my general health and wellness recommendations. Some of them may apply to you better than others. Please use common sense as you try these suggestions and feel free to ask me any questions.   ACTIVITY/FITNESS Mental, social, emotional and physical stimulation are very important for brain and body health. Try learning a new activity (arts, music, language, sports, games).  Keep moving your body to the best of your abilities. You can do this at home, inside or outside, the park, community center, gym or anywhere you like. Consider a physical therapist or personal trainer to get started. Consider the app Sworkit. Fitness trackers such as smart-watches, smart-phones or Fitbits can help as well.   NUTRITION Eat more plants: colorful vegetables, nuts, seeds and berries.  Eat less sugar, salt, preservatives and processed foods.  Avoid toxins such as cigarettes and alcohol.  Drink water when you are thirsty. Warm water with a slice of lemon is an excellent morning drink to start the day.  Consider these websites for more information The Nutrition Source (htthttps://www.henry-hernandez.biz/recision Nutrition (wwwWindowBlog.ch RELAXATION Consider practicing mindfulness meditation or other relaxation techniques such as deep breathing, prayer, yoga, tai chi, massage.  See website mindful.org or the apps Headspace or Calm to help get started.   SLEEP Try to get at least 7-8+ hours sleep per day. Regular exercise and reduced caffeine will help you sleep better. Practice good sleep hygeine techniques. See website sleep.org for more information.   PLANNING Prepare estate planning, living will, healthcare POA documents. Sometimes this is best planned with the help of an attorney. Theconversationproject.org and agingwithdignity.org are excellent resources.

## 2015-08-15 ENCOUNTER — Encounter: Payer: Self-pay | Admitting: Cardiology

## 2015-08-15 ENCOUNTER — Ambulatory Visit (INDEPENDENT_AMBULATORY_CARE_PROVIDER_SITE_OTHER): Payer: Medicare HMO | Admitting: Cardiology

## 2015-08-15 ENCOUNTER — Telehealth: Payer: Self-pay | Admitting: Cardiology

## 2015-08-15 ENCOUNTER — Encounter: Payer: Self-pay | Admitting: *Deleted

## 2015-08-15 VITALS — BP 125/83 | HR 69 | Ht 68.0 in | Wt 164.0 lb

## 2015-08-15 DIAGNOSIS — R002 Palpitations: Secondary | ICD-10-CM

## 2015-08-15 DIAGNOSIS — R079 Chest pain, unspecified: Secondary | ICD-10-CM | POA: Diagnosis not present

## 2015-08-15 NOTE — Telephone Encounter (Signed)
Lexiscan - chest pain Scheduled at [facility] March 21 arrive at 8:15
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
## 2015-08-15 NOTE — Patient Instructions (Signed)
Your physician has requested that you have a lexiscan myoview. For further information please visit [email or web address]. Please follow instruction sheet, as given. Office will contact with results via phone or letter.   Continue all current medications. Follow up to be based on test results.

## 2015-08-23 ENCOUNTER — Encounter (HOSPITAL_COMMUNITY): Payer: Self-pay

## 2015-08-23 ENCOUNTER — Encounter (HOSPITAL_COMMUNITY)
Admission: RE | Admit: 2015-08-23 | Discharge: 2015-08-23 | Disposition: A | Discharge status: Home / Self Care | Payer: Medicare HMO | Source: Ambulatory Visit | Attending: Cardiology | Admitting: Cardiology

## 2015-08-23 ENCOUNTER — Encounter (HOSPITAL_COMMUNITY): Payer: Medicare HMO

## 2015-08-23 DIAGNOSIS — R079 Chest pain, unspecified: Secondary | ICD-10-CM | POA: Diagnosis not present

## 2015-08-23 LAB — NM MYOCAR MULTI W/SPECT W/WALL MOTION / EF
CHL CUP RESTING HR STRESS: 51 {beats}/min
CSEPPHR: 93 {beats}/min
LHR: 0.28
LVDIAVOL: 73 mL (ref 62–150)
LVSYSVOL: 32 mL
SDS: 1
SRS: 0
SSS: 1
TID: 1.02

## 2015-08-23 MED ORDER — TECHNETIUM TC 99M SESTAMIBI - CARDIOLITE
30.0000 | Freq: Once | INTRAVENOUS | Status: AC | PRN
  Administered 2015-08-23: 30 via INTRAVENOUS

## 2015-08-23 MED ORDER — SODIUM CHLORIDE 0.9% FLUSH
INTRAVENOUS | Status: AC
  Administered 2015-08-23: 10 mL via INTRAVENOUS
  Filled 2015-08-23: qty 10

## 2015-08-23 MED ORDER — TECHNETIUM TC 99M SESTAMIBI GENERIC - CARDIOLITE
10.0000 | Freq: Once | INTRAVENOUS | Status: AC | PRN
  Administered 2015-08-23: 10 via INTRAVENOUS

## 2015-08-23 MED ORDER — REGADENOSON 0.4 MG/5ML IV SOLN
INTRAVENOUS | Status: AC
  Administered 2015-08-23: 0.4 mg via INTRAVENOUS
  Filled 2015-08-23: qty 5

## 2015-08-25 NOTE — Telephone Encounter (Signed)
-----   Message from Antoine PocheJonathan F Branch, MD sent at 08/24/2015  5:20 PM EDT ----- Stress test looks good, no evidence of any blockages. How are his symptoms? Is he still having episodes of his heart racing/fluttering as well?  Dominga FerryJ Branch MD

## 2015-08-25 NOTE — Telephone Encounter (Signed)
I spoke with patient,placed order for event monitor

## 2015-08-25 NOTE — Telephone Encounter (Signed)
Needs 2 week event monitor for palpitations   Dominga FerryJ Branch MD

## 2015-08-25 NOTE — Telephone Encounter (Signed)
Made pt aware of test results. Send copy to pcp. Pt still complains of having dizzy spells, heart beating fast at times and slow at times. He states that he has chest pains at different times whether sitting or laying down. He stated that he knows there is something not right and he wants to know what the plan is now that his stress test was normal?? Please advise.

## 2015-08-27 ENCOUNTER — Ambulatory Visit (INDEPENDENT_AMBULATORY_CARE_PROVIDER_SITE_OTHER): Payer: Medicare HMO

## 2015-08-27 DIAGNOSIS — R000 Tachycardia, unspecified: Secondary | ICD-10-CM

## 2015-08-27 DIAGNOSIS — R42 Dizziness and giddiness: Secondary | ICD-10-CM

## 2015-08-29 ENCOUNTER — Other Ambulatory Visit: Payer: Self-pay

## 2015-08-29 DIAGNOSIS — R42 Dizziness and giddiness: Secondary | ICD-10-CM

## 2015-08-29 DIAGNOSIS — R000 Tachycardia, unspecified: Secondary | ICD-10-CM

## 2015-09-07 ENCOUNTER — Telehealth: Payer: Self-pay | Admitting: Cardiology

## 2015-09-07 NOTE — Telephone Encounter (Signed)
Called pt. No answer- vm full. Wanted to offer to order the super sensitive patches, if that would help. Will try back.

## 2015-09-07 NOTE — Telephone Encounter (Signed)
States that patient can not wear event monitor due to patches breaking out patient's chest. / tg

## 2015-10-24 ENCOUNTER — Ambulatory Visit (INDEPENDENT_AMBULATORY_CARE_PROVIDER_SITE_OTHER): Payer: Medicare HMO | Admitting: Diagnostic Neuroimaging

## 2015-10-24 ENCOUNTER — Encounter: Payer: Self-pay | Admitting: Diagnostic Neuroimaging

## 2015-10-24 VITALS — BP 128/85 | HR 79 | Ht 68.0 in | Wt 163.4 lb

## 2015-10-24 DIAGNOSIS — G40109 Localization-related (focal) (partial) symptomatic epilepsy and epileptic syndromes with simple partial seizures, not intractable, without status epilepticus: Secondary | ICD-10-CM

## 2015-10-24 DIAGNOSIS — G060 Intracranial abscess and granuloma: Secondary | ICD-10-CM | POA: Diagnosis not present

## 2015-10-24 MED ORDER — LEVETIRACETAM 500 MG PO TABS: 1000.0000 mg | ORAL_TABLET | Freq: Two times a day (BID) | ORAL | Status: DC

## 2015-10-24 MED ORDER — PHENYTOIN SODIUM EXTENDED 100 MG PO CAPS: 100.0000 mg | ORAL_CAPSULE | Freq: Every day | ORAL | Status: DC

## 2015-10-24 MED ORDER — CARBAMAZEPINE 200 MG PO TABS: 200.0000 mg | ORAL_TABLET | Freq: Two times a day (BID) | ORAL | Status: DC
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
## 2015-10-24 NOTE — Patient Instructions (Signed)
Reduce dilantin to 100mg  daily.  Continue other medications.

## 2015-10-28 ENCOUNTER — Ambulatory Visit: Payer: Medicare HMO | Admitting: Family Medicine

## 2015-11-22 ENCOUNTER — Encounter: Payer: Self-pay | Admitting: Family Medicine

## 2015-11-22 ENCOUNTER — Ambulatory Visit (INDEPENDENT_AMBULATORY_CARE_PROVIDER_SITE_OTHER): Payer: Medicare HMO | Admitting: Family Medicine

## 2015-11-22 VITALS — BP 126/92 | HR 72 | Temp 98.3°F | Resp 16 | Ht 67.0 in | Wt 162.0 lb

## 2015-11-22 DIAGNOSIS — G40909 Epilepsy, unspecified, not intractable, without status epilepticus: Secondary | ICD-10-CM

## 2015-11-22 DIAGNOSIS — E785 Hyperlipidemia, unspecified: Secondary | ICD-10-CM | POA: Diagnosis not present

## 2015-11-22 LAB — COMPLETE METABOLIC PANEL WITH GFR
ALK PHOS: 88 U/L (ref 40–115)
ALT: 14 U/L (ref 9–46)
AST: 15 U/L (ref 10–40)
Albumin: 4.6 g/dL (ref 3.6–5.1)
BILIRUBIN TOTAL: 0.4 mg/dL (ref 0.2–1.2)
BUN: 18 mg/dL (ref 7–25)
CALCIUM: 9.7 mg/dL (ref 8.6–10.3)
CO2: 29 mmol/L (ref 20–31)
CREATININE: 1.75 mg/dL — AB (ref 0.60–1.35)
Chloride: 101 mmol/L (ref 98–110)
GFR, EST AFRICAN AMERICAN: 54 mL/min — AB (ref >=60)
GFR, EST NON AFRICAN AMERICAN: 47 mL/min — AB (ref >=60)
Glucose, Bld: 89 mg/dL (ref 70–99)
Potassium: 5 mmol/L (ref 3.5–5.3)
Sodium: 139 mmol/L (ref 135–146)
TOTAL PROTEIN: 7.4 g/dL (ref 6.1–8.1)

## 2015-11-22 LAB — LIPID PANEL
CHOLESTEROL: 165 mg/dL (ref 125–200)
HDL: 24 mg/dL — AB (ref >=40)
TRIGLYCERIDES: 778 mg/dL — AB (ref <150)
Total CHOL/HDL Ratio: 6.9 Ratio — ABNORMAL HIGH (ref <=5.0)
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
## 2015-12-07 ENCOUNTER — Telehealth: Payer: Self-pay | Admitting: *Deleted

## 2015-12-07 NOTE — Telephone Encounter (Signed)
Pt medical records mailed to Disability Determination Section on 12/07/2015. Attn Franchot MimesMichelle Coble

## 2015-12-09 ENCOUNTER — Telehealth: Payer: Self-pay | Admitting: Cardiology

## 2015-12-09 NOTE — Telephone Encounter (Signed)
Called pt mother to give monitor results. No answer. Voicemail full.

## 2015-12-09 NOTE — Telephone Encounter (Signed)
pls call pt's mother concerning the pt's heart monitor from April

## 2015-12-09 NOTE — Telephone Encounter (Signed)
Event monitor was overall normal, no significant abnormal heart rhythms. Overall heart workup has been negative. How are his symptoms doing? Let her know about the test results issues we were having with epic at that time and apologize for late notification.   Dominga FerryJ Branch MD   Patient's mother notified of monitor results and voiced understanding.

## 2016-01-03 ENCOUNTER — Encounter: Payer: Self-pay | Admitting: Family Medicine

## 2016-01-04 ENCOUNTER — Other Ambulatory Visit: Payer: Self-pay | Admitting: Family Medicine

## 2016-01-04 DIAGNOSIS — N189 Chronic kidney disease, unspecified: Secondary | ICD-10-CM

## 2016-01-12 ENCOUNTER — Other Ambulatory Visit: Payer: Self-pay | Admitting: Family Medicine

## 2016-01-12 MED ORDER — FENOFIBRATE 160 MG PO TABS: 160.0000 mg | ORAL_TABLET | Freq: Every day | ORAL | 5 refills | Status: DC

## 2016-01-17 ENCOUNTER — Telehealth: Payer: Self-pay | Admitting: Family Medicine

## 2016-01-17 NOTE — Telephone Encounter (Signed)
Pt can not afford the fenofibrate as it was 146$ with his insurance - would like to know if something else can be called in?

## 2016-01-19 MED ORDER — ATORVASTATIN CALCIUM 40 MG PO TABS: 40.0000 mg | ORAL_TABLET | Freq: Every day | ORAL | 3 refills | Status: DC

## 2016-01-19 NOTE — Telephone Encounter (Signed)
Dc fenofibrate and try lipitor 40 poqday.  Recheck in 3 months.

## 2016-01-19 NOTE — Telephone Encounter (Signed)
Tried to call patient's mother and mailbox is full - rx [facility]

## 2016-01-25 NOTE — Telephone Encounter (Signed)
Alexander Collier aware

## 2016-02-16 ENCOUNTER — Other Ambulatory Visit: Payer: Self-pay | Admitting: Family Medicine

## 2016-04-25 ENCOUNTER — Other Ambulatory Visit: Payer: Self-pay | Admitting: Family Medicine

## 2016-05-04 ENCOUNTER — Ambulatory Visit: Payer: Medicare HMO | Admitting: Diagnostic Neuroimaging

## 2016-05-07 ENCOUNTER — Encounter: Payer: Self-pay | Admitting: Diagnostic Neuroimaging

## 2016-06-26 ENCOUNTER — Other Ambulatory Visit: Payer: Self-pay | Admitting: Family Medicine

## 2016-07-30 ENCOUNTER — Ambulatory Visit: Payer: Medicare HMO | Admitting: Diagnostic Neuroimaging

## 2016-07-30 ENCOUNTER — Encounter: Payer: Self-pay | Admitting: *Deleted

## 2016-07-30 ENCOUNTER — Ambulatory Visit: Payer: Self-pay | Admitting: Diagnostic Neuroimaging

## 2016-07-30 NOTE — Progress Notes (Signed)
07/30/16 Patient left without being seen. He stated he had to be back in  by 1 pm to pick up his daughter. Mother stated they will call back to reschedule. Provider was running behind.

## 2016-10-17 ENCOUNTER — Telehealth: Payer: Self-pay | Admitting: Family Medicine

## 2016-10-17 NOTE — Telephone Encounter (Signed)
Needs letter for Jury Duty

## 2016-10-19 ENCOUNTER — Encounter: Payer: Self-pay | Admitting: Family Medicine

## 2016-10-19 NOTE — Telephone Encounter (Signed)
Letter is in his chart 

## 2016-10-23 NOTE — Telephone Encounter (Signed)
Printed signed and left up front - Debra aware to p/u

## 2016-10-26 ENCOUNTER — Other Ambulatory Visit: Payer: Self-pay | Admitting: Family Medicine

## 2016-11-19 ENCOUNTER — Telehealth: Payer: Self-pay | Admitting: Diagnostic Neuroimaging

## 2016-11-19 MED ORDER — LEVETIRACETAM 500 MG PO TABS: 1000.0000 mg | ORAL_TABLET | Freq: Two times a day (BID) | ORAL | 4 refills | Status: DC

## 2016-11-19 MED ORDER — CARBAMAZEPINE 200 MG PO TABS: 200.0000 mg | ORAL_TABLET | Freq: Two times a day (BID) | ORAL | 1 refills | Status: DC

## 2016-11-19 MED ORDER — PHENYTOIN SODIUM EXTENDED 100 MG PO CAPS: 100.0000 mg | ORAL_CAPSULE | Freq: Every day | ORAL | 1 refills | Status: DC

## 2016-11-19 NOTE — Telephone Encounter (Signed)
[facility] [place] per mother's request. The patient needs to keep follow up in Oct 2018.

## 2016-11-19 NOTE — Addendum Note (Signed)
Addended by: [place] PinkHESSON, Kyah Buesing C on: 11/19/2016 11:36 AM   Modules accepted: Orders

## 2016-11-19 NOTE — Telephone Encounter (Signed)
Pt's mother request refill for levETIRAcetam (KEPPRA) 500 MG tablet, carbamazepine (TEGRETOL) 200 MG tablet and phenytoin (DILANTIN) 100 MG ER capsule sent to [facility]-this is a new pharmacy for him. An appt has been scheduled for 10/3. Pt has been added to wait list

## 2016-11-29 ENCOUNTER — Ambulatory Visit: Payer: Medicare HMO | Admitting: Family Medicine

## 2016-12-06 ENCOUNTER — Ambulatory Visit: Payer: Medicare HMO | Admitting: Family Medicine

## 2016-12-14 ENCOUNTER — Encounter: Payer: Self-pay | Admitting: Family Medicine

## 2016-12-14 ENCOUNTER — Ambulatory Visit (INDEPENDENT_AMBULATORY_CARE_PROVIDER_SITE_OTHER): Payer: Medicare HMO | Admitting: Family Medicine

## 2016-12-14 VITALS — BP 122/86 | HR 60 | Temp 98.3°F | Resp 14 | Ht 67.0 in | Wt 165.0 lb

## 2016-12-14 DIAGNOSIS — N183 Chronic kidney disease, stage 3 unspecified: Secondary | ICD-10-CM

## 2016-12-14 DIAGNOSIS — G40909 Epilepsy, unspecified, not intractable, without status epilepticus: Secondary | ICD-10-CM

## 2016-12-14 DIAGNOSIS — Z0000 Encounter for general adult medical examination without abnormal findings: Secondary | ICD-10-CM

## 2016-12-14 DIAGNOSIS — E785 Hyperlipidemia, unspecified: Secondary | ICD-10-CM | POA: Diagnosis not present

## 2016-12-14 LAB — CBC WITH DIFFERENTIAL/PLATELET
BASOS PCT: 1 %
Basophils Absolute: 65 cells/uL (ref 0–200)
Eosinophils Absolute: 130 cells/uL (ref 15–500)
Eosinophils Relative: 2 %
HEMATOCRIT: 47.9 % (ref 38.5–50.0)
Hemoglobin: 16.5 g/dL (ref 13.0–17.0)
LYMPHS PCT: 32 %
Lymphs Abs: 2080 cells/uL (ref 850–3900)
MCH: 34.4 pg — ABNORMAL HIGH (ref 27.0–33.0)
MCHC: 34.4 g/dL (ref 32.0–36.0)
MCV: 99.8 fL (ref 80.0–100.0)
MONO ABS: 520 {cells}/uL (ref 200–950)
MONOS PCT: 8 %
MPV: 10.7 fL (ref 7.5–12.5)
Neutro Abs: 3705 cells/uL (ref 1500–7800)
Neutrophils Relative %: 57 %
PLATELETS: 214 10*3/uL (ref 140–400)
RBC: 4.8 MIL/uL (ref 4.20–5.80)
RDW: 12.8 % (ref 11.0–15.0)
WBC: 6.5 10*3/uL (ref 3.8–10.8)

## 2016-12-14 LAB — URINALYSIS, MICROSCOPIC ONLY
Bacteria, UA: NONE SEEN [HPF]
CASTS: NONE SEEN [LPF]
Crystals: NONE SEEN [HPF]
Squamous Epithelial / LPF: NONE SEEN [HPF] (ref <=5)
WBC UA: NONE SEEN WBC/HPF (ref <=5)
Yeast: NONE SEEN [HPF]

## 2016-12-14 LAB — URINALYSIS, ROUTINE W REFLEX MICROSCOPIC
BILIRUBIN URINE: NEGATIVE
Glucose, UA: NEGATIVE
KETONES UR: NEGATIVE
Leukocytes, UA: NEGATIVE
NITRITE: NEGATIVE
Specific Gravity, Urine: 1.025 (ref 1.001–1.035)
pH: 5.5 (ref 5.0–8.0)
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
## 2016-12-15 LAB — COMPLETE METABOLIC PANEL WITH GFR
ALT: 16 U/L (ref 9–46)
AST: 20 U/L (ref 10–40)
Albumin: 4.6 g/dL (ref 3.6–5.1)
Alkaline Phosphatase: 105 U/L (ref 40–115)
BUN: 11 mg/dL (ref 7–25)
CHLORIDE: 101 mmol/L (ref 98–110)
CO2: 25 mmol/L (ref 20–31)
CREATININE: 1.4 mg/dL — AB (ref 0.60–1.35)
Calcium: 9.4 mg/dL (ref 8.6–10.3)
GFR, Est African American: 71 mL/min (ref >=60)
GFR, Est Non African American: 61 mL/min (ref >=60)
GLUCOSE: 96 mg/dL (ref 70–99)
Potassium: 4.4 mmol/L (ref 3.5–5.3)
SODIUM: 139 mmol/L (ref 135–146)
Total Bilirubin: 0.4 mg/dL (ref 0.2–1.2)
Total Protein: 7.5 g/dL (ref 6.1–8.1)

## 2016-12-15 LAB — LIPID PANEL
Cholesterol: 84 mg/dL (ref <200)
HDL: 24 mg/dL — ABNORMAL LOW (ref >40)
Total CHOL/HDL Ratio: 3.5 Ratio (ref <5.0)
Triglycerides: 317 mg/dL — ABNORMAL HIGH (ref <150)
VLDL: 63 mg/dL — AB (ref <30)

## 2016-12-15 LAB — HIV ANTIBODY (ROUTINE TESTING W REFLEX): HIV 1&2 Ab, 4th Generation: NONREACTIVE

## 2016-12-17 ENCOUNTER — Encounter: Payer: Self-pay | Admitting: Family Medicine

## 2016-12-17 ENCOUNTER — Other Ambulatory Visit: Payer: Self-pay | Admitting: Family Medicine

## 2016-12-17 MED ORDER — FENOFIBRATE 160 MG PO TABS: 160.0000 mg | ORAL_TABLET | Freq: Every day | ORAL | 5 refills | Status: DC

## 2017-02-22 ENCOUNTER — Other Ambulatory Visit: Payer: Self-pay | Admitting: Family Medicine

## 2017-03-06 ENCOUNTER — Encounter: Payer: Self-pay | Admitting: Diagnostic Neuroimaging

## 2017-03-06 ENCOUNTER — Ambulatory Visit (INDEPENDENT_AMBULATORY_CARE_PROVIDER_SITE_OTHER): Payer: Medicare HMO | Admitting: Diagnostic Neuroimaging

## 2017-03-06 VITALS — Wt 166.6 lb

## 2017-03-06 DIAGNOSIS — G060 Intracranial abscess and granuloma: Secondary | ICD-10-CM

## 2017-03-06 DIAGNOSIS — G40109 Localization-related (focal) (partial) symptomatic epilepsy and epileptic syndromes with simple partial seizures, not intractable, without status epilepticus: Secondary | ICD-10-CM | POA: Diagnosis not present

## 2017-03-06 MED ORDER — PHENYTOIN SODIUM EXTENDED 100 MG PO CAPS: 100.0000 mg | ORAL_CAPSULE | Freq: Every day | ORAL | 1 refills | Status: DC

## 2017-03-06 MED ORDER — LEVETIRACETAM 1000 MG PO TABS: 1000.0000 mg | ORAL_TABLET | Freq: Two times a day (BID) | ORAL | 4 refills | Status: DC

## 2017-03-06 MED ORDER — CARBAMAZEPINE 200 MG PO TABS: 200.0000 mg | ORAL_TABLET | Freq: Two times a day (BID) | ORAL | 4 refills | Status: DC
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
## 2017-03-06 NOTE — Patient Instructions (Signed)
-   continue levetiracetam  twice a day  - continue carbamazepine  twice a day  - continue phenytoin  per day

## 2017-05-09 IMAGING — NM NM MYOCAR MULTI W/SPECT W/WALL MOTION & EF
2 series · 12 of 12 positions shown · non-contrast
Comparison: none

[Series 1: rest · 8.28mm/px · 6 of 64 frames shown]
[frame 6/64]
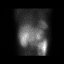
[frame 16/64]
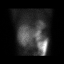
[frame 27/64]
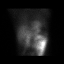
[frame 38/64]
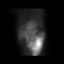
[frame 48/64]
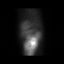
[frame 59/64]
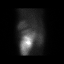

[Series 2: stress gated · 8.28mm/px · 6 of 64 frames shown]
[frame 6/64]
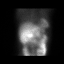
[frame 16/64]
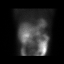
[frame 27/64]
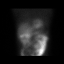
[frame 38/64]
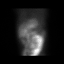
[frame 48/64]
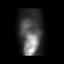
[frame 59/64]
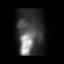

[12 of 12 positions shown; findings below may reference images not displayed]

Canned report from images found in remote index.

Refer to host system for actual result text.

## 2017-05-31 ENCOUNTER — Other Ambulatory Visit: Payer: Self-pay | Admitting: Family Medicine

## 2017-07-02 ENCOUNTER — Other Ambulatory Visit: Payer: Self-pay

## 2017-07-02 ENCOUNTER — Encounter (HOSPITAL_COMMUNITY): Payer: Self-pay | Admitting: Emergency Medicine

## 2017-07-02 ENCOUNTER — Emergency Department (HOSPITAL_COMMUNITY)
Admission: EM | Admit: 2017-07-02 | Discharge: 2017-07-02 | Disposition: A | Discharge status: Home / Self Care | Payer: Medicare HMO | Attending: Emergency Medicine | Admitting: Emergency Medicine

## 2017-07-02 ENCOUNTER — Emergency Department (HOSPITAL_COMMUNITY): Payer: Medicare HMO

## 2017-07-02 DIAGNOSIS — R69 Illness, unspecified: Secondary | ICD-10-CM | POA: Diagnosis not present

## 2017-07-02 DIAGNOSIS — F17290 Nicotine dependence, other tobacco product, uncomplicated: Secondary | ICD-10-CM | POA: Diagnosis not present

## 2017-07-02 DIAGNOSIS — R05 Cough: Secondary | ICD-10-CM | POA: Diagnosis not present

## 2017-07-02 DIAGNOSIS — Z79899 Other long term (current) drug therapy: Secondary | ICD-10-CM | POA: Diagnosis not present

## 2017-07-02 DIAGNOSIS — I129 Hypertensive chronic kidney disease with stage 1 through stage 4 chronic kidney disease, or unspecified chronic kidney disease: Secondary | ICD-10-CM | POA: Insufficient documentation

## 2017-07-02 DIAGNOSIS — B349 Viral infection, unspecified: Secondary | ICD-10-CM | POA: Insufficient documentation

## 2017-07-02 DIAGNOSIS — J029 Acute pharyngitis, unspecified: Secondary | ICD-10-CM | POA: Diagnosis not present

## 2017-07-02 DIAGNOSIS — N189 Chronic kidney disease, unspecified: Secondary | ICD-10-CM | POA: Insufficient documentation

## 2017-07-02 DIAGNOSIS — R0602 Shortness of breath: Secondary | ICD-10-CM | POA: Diagnosis not present

## 2017-07-02 LAB — RAPID STREP SCREEN (MED CTR MEBANE ONLY): STREPTOCOCCUS, GROUP A SCREEN (DIRECT): NEGATIVE

## 2017-07-02 MED ORDER — FLUTICASONE PROPIONATE 50 MCG/ACT NA SUSP: 1.0000 | Freq: Every day | NASAL | 2 refills | Status: DC

## 2017-07-02 MED ORDER — BENZONATATE 100 MG PO CAPS: 100.0000 mg | ORAL_CAPSULE | Freq: Three times a day (TID) | ORAL | 0 refills | Status: DC

## 2017-07-02 MED ORDER — ALBUTEROL SULFATE HFA 108 (90 BASE) MCG/ACT IN AERS: 1.0000 | INHALATION_SPRAY | Freq: Four times a day (QID) | RESPIRATORY_TRACT | 0 refills | Status: DC | PRN
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
## 2017-07-02 NOTE — Discharge Instructions (Signed)
You were seen in the emergency today and diagnosed with a viral illness.  Chest x-ray did not show signs of pneumonia.  Your strep swab is negative, we will culture this and call you if it comes back positive.  I have prescribed you multiple medications to treat your symptoms.   -Flonase to be used 1 spray in each nostril daily.  This medication is used to treat your congestion.  -Tessalon can be taken once every 8 hours as needed.  This medication is used to treat your cough.  -Inhaler- use every 6 hours as needed for difficulty breathing or wheezing  You will need to follow-up with your primary care provider in 5 days if your symptoms have not improved.  If you do not have a primary care provider one is provided in your discharge instructions.  Return to the emergency department for any new or worsening symptoms including but not limited to persistent fever for 5 days, difficulty breathing, chest pain, or passing out.

## 2017-07-02 NOTE — ED Triage Notes (Signed)
Pt c/o sore throat, productive cough, congestion for 2 weeks.  Per wife, pt woke up this morning with not being able to talk. No fever.

## 2017-07-05 LAB — CULTURE, GROUP A STREP (THRC)

## 2017-07-18 ENCOUNTER — Encounter: Payer: Self-pay | Admitting: Diagnostic Neuroimaging

## 2017-07-26 ENCOUNTER — Other Ambulatory Visit: Payer: Self-pay | Admitting: Diagnostic Neuroimaging

## 2017-08-01 ENCOUNTER — Encounter (HOSPITAL_COMMUNITY): Payer: Self-pay | Admitting: *Deleted

## 2017-08-01 ENCOUNTER — Emergency Department (HOSPITAL_COMMUNITY): Payer: Medicare HMO

## 2017-08-01 ENCOUNTER — Emergency Department (HOSPITAL_COMMUNITY)
Admission: EM | Admit: 2017-08-01 | Discharge: 2017-08-01 | Disposition: A | Discharge status: Home / Self Care | Payer: Medicare HMO | Attending: Emergency Medicine | Admitting: Emergency Medicine

## 2017-08-01 ENCOUNTER — Other Ambulatory Visit: Payer: Self-pay

## 2017-08-01 ENCOUNTER — Other Ambulatory Visit: Payer: Self-pay | Admitting: Family Medicine

## 2017-08-01 ENCOUNTER — Other Ambulatory Visit: Payer: Self-pay | Admitting: Diagnostic Neuroimaging

## 2017-08-01 DIAGNOSIS — K219 Gastro-esophageal reflux disease without esophagitis: Secondary | ICD-10-CM | POA: Diagnosis not present

## 2017-08-01 DIAGNOSIS — R69 Illness, unspecified: Secondary | ICD-10-CM | POA: Diagnosis not present

## 2017-08-01 DIAGNOSIS — I129 Hypertensive chronic kidney disease with stage 1 through stage 4 chronic kidney disease, or unspecified chronic kidney disease: Secondary | ICD-10-CM | POA: Insufficient documentation

## 2017-08-01 DIAGNOSIS — N189 Chronic kidney disease, unspecified: Secondary | ICD-10-CM | POA: Insufficient documentation

## 2017-08-01 DIAGNOSIS — F17290 Nicotine dependence, other tobacco product, uncomplicated: Secondary | ICD-10-CM | POA: Diagnosis not present

## 2017-08-01 DIAGNOSIS — R079 Chest pain, unspecified: Secondary | ICD-10-CM | POA: Diagnosis not present

## 2017-08-01 DIAGNOSIS — Z79899 Other long term (current) drug therapy: Secondary | ICD-10-CM | POA: Diagnosis not present

## 2017-08-01 DIAGNOSIS — R0602 Shortness of breath: Secondary | ICD-10-CM | POA: Diagnosis not present

## 2017-08-01 LAB — BASIC METABOLIC PANEL
ANION GAP: 14 (ref 5–15)
BUN: <5 mg/dL — ABNORMAL LOW (ref 6–20)
CALCIUM: 10.3 mg/dL (ref 8.9–10.3)
CHLORIDE: 98 mmol/L — AB (ref 101–111)
CO2: 27 mmol/L (ref 22–32)
Creatinine, Ser: 1.36 mg/dL — ABNORMAL HIGH (ref 0.61–1.24)
GFR calc non Af Amer: >60 mL/min (ref >60)
Glucose, Bld: 119 mg/dL — ABNORMAL HIGH (ref 65–99)
POTASSIUM: 3 mmol/L — AB (ref 3.5–5.1)
Sodium: 139 mmol/L (ref 135–145)

## 2017-08-01 LAB — CBC
HCT: 45.6 % (ref 39.0–52.0)
HEMOGLOBIN: 16.3 g/dL (ref 13.0–17.0)
MCH: 33.6 pg (ref 26.0–34.0)
MCHC: 35.7 g/dL (ref 30.0–36.0)
MCV: 94 fL (ref 78.0–100.0)
Platelets: 203 10*3/uL (ref 150–400)
RBC: 4.85 MIL/uL (ref 4.22–5.81)
RDW: 12.2 % (ref 11.5–15.5)
WBC: 6.6 10*3/uL (ref 4.0–10.5)

## 2017-08-01 LAB — HEPATIC FUNCTION PANEL
ALBUMIN: 4.5 g/dL (ref 3.5–5.0)
ALK PHOS: 86 U/L (ref 38–126)
ALT: 22 U/L (ref 17–63)
AST: 25 U/L (ref 15–41)
Bilirubin, Direct: <0.1 mg/dL — ABNORMAL LOW (ref 0.1–0.5)
TOTAL PROTEIN: 7.8 g/dL (ref 6.5–8.1)
Total Bilirubin: 0.5 mg/dL (ref 0.3–1.2)

## 2017-08-01 LAB — I-STAT TROPONIN, ED: TROPONIN I, POC: 0.01 ng/mL (ref 0.00–0.08)

## 2017-08-01 LAB — LIPASE, BLOOD: Lipase: 30 U/L (ref 11–51)

## 2017-08-01 MED ORDER — SODIUM CHLORIDE 0.9 % IV BOLUS (SEPSIS)
1000.0000 mL | Freq: Once | INTRAVENOUS | Status: AC
  Administered 2017-08-01: 1000 mL via INTRAVENOUS

## 2017-08-01 MED ORDER — GI COCKTAIL ~~LOC~~
30.0000 mL | Freq: Once | ORAL | Status: AC
  Administered 2017-08-01: 30 mL via ORAL
  Filled 2017-08-01: qty 30

## 2017-08-01 MED ORDER — SODIUM CHLORIDE 0.9 % IV BOLUS (SEPSIS): 500.0000 mL | Freq: Once | INTRAVENOUS | Status: DC

## 2017-08-01 MED ORDER — POTASSIUM CHLORIDE CRYS ER 20 MEQ PO TBCR
40.0000 meq | EXTENDED_RELEASE_TABLET | Freq: Once | ORAL | Status: AC
  Administered 2017-08-01: 40 meq via ORAL
  Filled 2017-08-01: qty 2

## 2017-08-01 MED ORDER — ONDANSETRON HCL 4 MG/2ML IJ SOLN
4.0000 mg | Freq: Once | INTRAMUSCULAR | Status: AC
  Administered 2017-08-01: 4 mg via INTRAVENOUS
  Filled 2017-08-01: qty 2

## 2017-08-01 MED ORDER — FAMOTIDINE IN NACL 20-0.9 MG/50ML-% IV SOLN
20.0000 mg | Freq: Once | INTRAVENOUS | Status: AC
  Administered 2017-08-01: 20 mg via INTRAVENOUS
  Filled 2017-08-01: qty 50

## 2017-08-01 NOTE — ED Triage Notes (Signed)
Pt c/o central chest pain that started while lying in bed tonight; pt states the pain radiates to his neck area and he feels like he is choking; pt c/o tightness to his chest with sob

## 2017-08-01 NOTE — Discharge Instructions (Signed)
Start your zantac twice a day for the next 1-2 weeks then once a day. Look at the information about the diet for GERD. Let Dr Tanya NonesPickard know if you aren't improving.
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
## 2017-08-01 NOTE — ED Notes (Signed)
Pt called out and stated the gi cocktail made him feel like he couldn't swallow at all; pt given a cup of water and Dr. Lynelle DoctorKnapp informed

## 2017-08-09 ENCOUNTER — Other Ambulatory Visit: Payer: Self-pay | Admitting: Diagnostic Neuroimaging

## 2017-08-09 ENCOUNTER — Other Ambulatory Visit: Payer: Self-pay | Admitting: Family Medicine

## 2017-08-28 ENCOUNTER — Other Ambulatory Visit: Payer: Self-pay | Admitting: Family Medicine

## 2017-08-28 DIAGNOSIS — N189 Chronic kidney disease, unspecified: Secondary | ICD-10-CM

## 2017-09-06 ENCOUNTER — Telehealth: Payer: Self-pay | Admitting: Diagnostic Neuroimaging

## 2017-09-06 NOTE — Telephone Encounter (Signed)
Brett CanalesSteve from pharmacy is asking for a call back re: pt's carbamazepine (TEGRETOL) 200 MG tablet

## 2017-09-06 NOTE — Telephone Encounter (Signed)
Spoke to pharmacist and he relayed that pts carbamazepine (generic) manufacturer will be changed to [employer], from [employer].   I relayed this was ok, please let pt know as well, when he picks up.  He will do this.

## 2017-09-06 NOTE — Telephone Encounter (Signed)
Agree. -VRP 

## 2017-10-24 DIAGNOSIS — E876 Hypokalemia: Secondary | ICD-10-CM | POA: Diagnosis not present

## 2017-10-24 DIAGNOSIS — N183 Chronic kidney disease, stage 3 (moderate): Secondary | ICD-10-CM | POA: Diagnosis not present

## 2017-10-24 DIAGNOSIS — R809 Proteinuria, unspecified: Secondary | ICD-10-CM | POA: Diagnosis not present

## 2017-11-04 ENCOUNTER — Other Ambulatory Visit (HOSPITAL_COMMUNITY): Payer: Self-pay | Admitting: Medical

## 2017-11-04 ENCOUNTER — Other Ambulatory Visit (HOSPITAL_COMMUNITY): Payer: Self-pay | Admitting: Nephrology

## 2017-11-04 DIAGNOSIS — N183 Chronic kidney disease, stage 3 unspecified: Secondary | ICD-10-CM

## 2017-11-15 DIAGNOSIS — D509 Iron deficiency anemia, unspecified: Secondary | ICD-10-CM | POA: Diagnosis not present

## 2017-11-15 DIAGNOSIS — Z1159 Encounter for screening for other viral diseases: Secondary | ICD-10-CM | POA: Diagnosis not present

## 2017-11-15 DIAGNOSIS — N183 Chronic kidney disease, stage 3 (moderate): Secondary | ICD-10-CM | POA: Diagnosis not present

## 2017-11-15 DIAGNOSIS — Z79899 Other long term (current) drug therapy: Secondary | ICD-10-CM | POA: Diagnosis not present

## 2017-11-15 DIAGNOSIS — D519 Vitamin B12 deficiency anemia, unspecified: Secondary | ICD-10-CM | POA: Diagnosis not present

## 2017-11-15 DIAGNOSIS — I10 Essential (primary) hypertension: Secondary | ICD-10-CM | POA: Diagnosis not present

## 2017-11-15 DIAGNOSIS — R300 Dysuria: Secondary | ICD-10-CM | POA: Diagnosis not present

## 2017-11-15 DIAGNOSIS — R809 Proteinuria, unspecified: Secondary | ICD-10-CM | POA: Diagnosis not present

## 2017-11-15 DIAGNOSIS — E559 Vitamin D deficiency, unspecified: Secondary | ICD-10-CM | POA: Diagnosis not present

## 2017-11-18 ENCOUNTER — Other Ambulatory Visit: Payer: Self-pay | Admitting: Diagnostic Neuroimaging

## 2017-11-18 ENCOUNTER — Other Ambulatory Visit: Payer: Self-pay | Admitting: Family Medicine

## 2017-11-18 ENCOUNTER — Ambulatory Visit (HOSPITAL_COMMUNITY)
Admission: RE | Admit: 2017-11-18 | Discharge: 2017-11-18 | Disposition: A | Discharge status: Home / Self Care | Payer: Medicare HMO | Source: Ambulatory Visit | Attending: Medical | Admitting: Medical

## 2017-11-18 DIAGNOSIS — N3289 Other specified disorders of bladder: Secondary | ICD-10-CM | POA: Diagnosis not present

## 2017-11-18 DIAGNOSIS — N183 Chronic kidney disease, stage 3 unspecified: Secondary | ICD-10-CM

## 2017-11-18 DIAGNOSIS — Z905 Acquired absence of kidney: Secondary | ICD-10-CM | POA: Diagnosis not present

## 2018-01-15 ENCOUNTER — Encounter: Payer: Self-pay | Admitting: Diagnostic Neuroimaging

## 2018-02-22 ENCOUNTER — Other Ambulatory Visit: Payer: Self-pay | Admitting: Family Medicine

## 2018-03-07 ENCOUNTER — Ambulatory Visit: Payer: Medicare HMO | Admitting: Diagnostic Neuroimaging

## 2018-03-10 ENCOUNTER — Ambulatory Visit: Payer: Medicare HMO | Admitting: Diagnostic Neuroimaging

## 2018-04-08 ENCOUNTER — Other Ambulatory Visit: Payer: Self-pay | Admitting: Diagnostic Neuroimaging

## 2018-04-21 DIAGNOSIS — T1501XA Foreign body in cornea, right eye, initial encounter: Secondary | ICD-10-CM | POA: Diagnosis not present

## 2018-05-03 ENCOUNTER — Emergency Department (HOSPITAL_COMMUNITY)
Admission: EM | Admit: 2018-05-03 | Discharge: 2018-05-03 | Disposition: A | Discharge status: Home / Self Care | Payer: Medicare HMO | Attending: Emergency Medicine | Admitting: Emergency Medicine

## 2018-05-03 ENCOUNTER — Emergency Department (HOSPITAL_COMMUNITY): Payer: Medicare HMO

## 2018-05-03 ENCOUNTER — Encounter (HOSPITAL_COMMUNITY): Payer: Self-pay

## 2018-05-03 DIAGNOSIS — R112 Nausea with vomiting, unspecified: Secondary | ICD-10-CM

## 2018-05-03 DIAGNOSIS — R079 Chest pain, unspecified: Secondary | ICD-10-CM | POA: Diagnosis not present

## 2018-05-03 DIAGNOSIS — R197 Diarrhea, unspecified: Secondary | ICD-10-CM

## 2018-05-03 DIAGNOSIS — R569 Unspecified convulsions: Secondary | ICD-10-CM

## 2018-05-03 DIAGNOSIS — R0789 Other chest pain: Secondary | ICD-10-CM | POA: Diagnosis not present

## 2018-05-03 DIAGNOSIS — G40909 Epilepsy, unspecified, not intractable, without status epilepticus: Secondary | ICD-10-CM | POA: Diagnosis not present

## 2018-05-03 DIAGNOSIS — I129 Hypertensive chronic kidney disease with stage 1 through stage 4 chronic kidney disease, or unspecified chronic kidney disease: Secondary | ICD-10-CM | POA: Insufficient documentation

## 2018-05-03 DIAGNOSIS — R0602 Shortness of breath: Secondary | ICD-10-CM | POA: Diagnosis not present

## 2018-05-03 DIAGNOSIS — Z79899 Other long term (current) drug therapy: Secondary | ICD-10-CM | POA: Diagnosis not present

## 2018-05-03 DIAGNOSIS — N189 Chronic kidney disease, unspecified: Secondary | ICD-10-CM | POA: Insufficient documentation

## 2018-05-03 LAB — BASIC METABOLIC PANEL
Anion gap: 10 (ref 5–15)
BUN: 14 mg/dL (ref 6–20)
CO2: 24 mmol/L (ref 22–32)
CREATININE: 1.29 mg/dL — AB (ref 0.61–1.24)
Calcium: 9.3 mg/dL (ref 8.9–10.3)
Chloride: 101 mmol/L (ref 98–111)
GFR calc Af Amer: >60 mL/min (ref >60)
Glucose, Bld: 162 mg/dL — ABNORMAL HIGH (ref 70–99)
POTASSIUM: 2.9 mmol/L — AB (ref 3.5–5.1)
SODIUM: 135 mmol/L (ref 135–145)

## 2018-05-03 LAB — PHENYTOIN LEVEL, TOTAL: Phenytoin Lvl: <2.5 ug/mL — ABNORMAL LOW (ref 10.0–20.0)

## 2018-05-03 LAB — CBC
HEMATOCRIT: 47.5 % (ref 39.0–52.0)
HEMOGLOBIN: 16.8 g/dL (ref 13.0–17.0)
MCH: 34.2 pg — AB (ref 26.0–34.0)
MCHC: 35.4 g/dL (ref 30.0–36.0)
MCV: 96.7 fL (ref 80.0–100.0)
Platelets: 225 10*3/uL (ref 150–400)
RBC: 4.91 MIL/uL (ref 4.22–5.81)
RDW: 11.9 % (ref 11.5–15.5)
WBC: 15.2 10*3/uL — ABNORMAL HIGH (ref 4.0–10.5)
nRBC: 0 % (ref 0.0–0.2)

## 2018-05-03 LAB — I-STAT TROPONIN, ED
TROPONIN I, POC: 0 ng/mL (ref 0.00–0.08)
Troponin i, poc: 0.02 ng/mL (ref 0.00–0.08)

## 2018-05-03 LAB — CARBAMAZEPINE LEVEL, TOTAL

## 2018-05-03 MED ORDER — SODIUM CHLORIDE 0.9 % IV BOLUS
1000.0000 mL | Freq: Once | INTRAVENOUS | Status: AC
  Administered 2018-05-03: 1000 mL via INTRAVENOUS

## 2018-05-03 MED ORDER — ONDANSETRON HCL 4 MG PO TABS: 4.0000 mg | ORAL_TABLET | Freq: Three times a day (TID) | ORAL | 0 refills | Status: DC | PRN

## 2018-05-03 MED ORDER — PHENYTOIN 50 MG PO CHEW
300.0000 mg | CHEWABLE_TABLET | Freq: Once | ORAL | Status: AC
  Administered 2018-05-03: 300 mg via ORAL
  Filled 2018-05-03: qty 6

## 2018-05-03 MED ORDER — CARBAMAZEPINE 200 MG PO TABS
200.0000 mg | ORAL_TABLET | Freq: Once | ORAL | Status: AC
  Administered 2018-05-03: 200 mg via ORAL
  Filled 2018-05-03: qty 1

## 2018-05-03 MED ORDER — KETOROLAC TROMETHAMINE 30 MG/ML IJ SOLN
30.0000 mg | Freq: Once | INTRAMUSCULAR | Status: AC
  Administered 2018-05-03: 30 mg via INTRAVENOUS
  Filled 2018-05-03: qty 1

## 2018-05-03 MED ORDER — ONDANSETRON HCL 4 MG/2ML IJ SOLN
4.0000 mg | Freq: Once | INTRAMUSCULAR | Status: AC
  Administered 2018-05-03: 4 mg via INTRAVENOUS
  Filled 2018-05-03: qty 2

## 2018-05-03 MED ORDER — POTASSIUM CHLORIDE CRYS ER 20 MEQ PO TBCR
40.0000 meq | EXTENDED_RELEASE_TABLET | Freq: Once | ORAL | Status: AC
  Administered 2018-05-03: 40 meq via ORAL
  Filled 2018-05-03: qty 2

## 2018-05-03 NOTE — Progress Notes (Signed)
Patient having labs drawn when getting patient for exam. Brought patient after lab draws completed
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
## 2018-05-03 NOTE — ED Triage Notes (Signed)
Pt with seizure history, had a seizure at home this am per his wife and then c/o mid sternal chest pain afterward.  Per pt's wife, she would normally not bring the patient after a seizure, but came due to the chest pain

## 2018-05-03 NOTE — Discharge Instructions (Addendum)
You need to take your seizure medicine every day.  Two of your seizure medication levels came back today and they are both extremely low.  He can follow-up with Dr. Caren MacadamPickard's office or your neurologist office about your third seizure medication level.  Drink plenty of fluids.  Drink plenty of fluids today, if you feeling better at lunchtime he can eat a bland diet such as toast, crackers, [employer] chicken noodle soup.  Slowly advance your diet to normal.  If you continue to have diarrhea you can take Imodium over-the-counter.  Uses Zofran for nausea if needed.

## 2018-05-04 LAB — LEVETIRACETAM LEVEL: Levetiracetam Lvl: 7.7 ug/mL — ABNORMAL LOW (ref 10.0–40.0)

## 2018-06-02 ENCOUNTER — Encounter

## 2018-06-02 ENCOUNTER — Encounter: Payer: Self-pay | Admitting: Diagnostic Neuroimaging

## 2018-06-02 ENCOUNTER — Ambulatory Visit: Payer: Medicare HMO | Admitting: Diagnostic Neuroimaging

## 2018-06-02 VITALS — BP 127/82 | HR 67 | Ht 69.0 in | Wt 166.4 lb

## 2018-06-02 DIAGNOSIS — G060 Intracranial abscess and granuloma: Secondary | ICD-10-CM | POA: Diagnosis not present

## 2018-06-02 DIAGNOSIS — G40109 Localization-related (focal) (partial) symptomatic epilepsy and epileptic syndromes with simple partial seizures, not intractable, without status epilepticus: Secondary | ICD-10-CM

## 2018-06-02 MED ORDER — LEVETIRACETAM 750 MG PO TABS: 1500.0000 mg | ORAL_TABLET | Freq: Two times a day (BID) | ORAL | 12 refills | Status: DC

## 2018-06-02 MED ORDER — CARBAMAZEPINE 200 MG PO TABS: 200.0000 mg | ORAL_TABLET | Freq: Two times a day (BID) | ORAL | 4 refills | Status: DC
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
## 2018-06-02 NOTE — Patient Instructions (Signed)
SEIZURE DISORDER  - increase levetiracetam to 1500mg  twice a day  - continue carbamazepine 200mg  twice a day  - stop phenytoin after starting higher dose of levetiracetam x 2 weeks

## 2018-06-15 ENCOUNTER — Other Ambulatory Visit: Payer: Self-pay | Admitting: Family Medicine

## 2018-07-24 ENCOUNTER — Ambulatory Visit: Payer: Medicare HMO | Admitting: Family Medicine

## 2018-07-25 ENCOUNTER — Ambulatory Visit: Payer: Medicare HMO | Admitting: Family Medicine

## 2018-07-28 ENCOUNTER — Ambulatory Visit (INDEPENDENT_AMBULATORY_CARE_PROVIDER_SITE_OTHER): Payer: Medicare HMO | Admitting: Family Medicine

## 2018-07-28 ENCOUNTER — Encounter: Payer: Self-pay | Admitting: Family Medicine

## 2018-07-28 VITALS — BP 136/92 | HR 68 | Temp 97.7°F | Resp 18 | Ht 67.0 in | Wt 171.0 lb

## 2018-07-28 DIAGNOSIS — E785 Hyperlipidemia, unspecified: Secondary | ICD-10-CM

## 2018-07-28 DIAGNOSIS — I10 Essential (primary) hypertension: Secondary | ICD-10-CM

## 2018-07-28 DIAGNOSIS — N189 Chronic kidney disease, unspecified: Secondary | ICD-10-CM

## 2018-07-28 DIAGNOSIS — G40909 Epilepsy, unspecified, not intractable, without status epilepticus: Secondary | ICD-10-CM | POA: Diagnosis not present
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
## 2018-07-31 LAB — COMPLETE METABOLIC PANEL WITH GFR
AG RATIO: 1.8 (calc) (ref 1.0–2.5)
ALT: 23 U/L (ref 9–46)
AST: 23 U/L (ref 10–40)
Albumin: 4.9 g/dL (ref 3.6–5.1)
Alkaline phosphatase (APISO): 91 U/L (ref 36–130)
BUN/Creatinine Ratio: 11 (calc) (ref 6–22)
BUN: 16 mg/dL (ref 7–25)
CO2: 28 mmol/L (ref 20–32)
Calcium: 9.6 mg/dL (ref 8.6–10.3)
Chloride: 102 mmol/L (ref 98–110)
Creat: 1.45 mg/dL — ABNORMAL HIGH (ref 0.60–1.35)
GFR, Est African American: 67 mL/min/{1.73_m2} (ref >=60)
GFR, Est Non African American: 58 mL/min/{1.73_m2} — ABNORMAL LOW (ref >=60)
Globulin: 2.7 g/dL (calc) (ref 1.9–3.7)
Glucose, Bld: 84 mg/dL (ref 65–99)
Potassium: 4 mmol/L (ref 3.5–5.3)
Sodium: 140 mmol/L (ref 135–146)
Total Bilirubin: 0.5 mg/dL (ref 0.2–1.2)
Total Protein: 7.6 g/dL (ref 6.1–8.1)

## 2018-07-31 LAB — CBC WITH DIFFERENTIAL/PLATELET
Absolute Monocytes: 752 cells/uL (ref 200–950)
Basophils Absolute: 55 cells/uL (ref 0–200)
Basophils Relative: 0.5 %
Eosinophils Absolute: 76 cells/uL (ref 15–500)
Eosinophils Relative: 0.7 %
HCT: 46.5 % (ref 38.5–50.0)
Hemoglobin: 16.7 g/dL (ref 13.2–17.1)
Lymphs Abs: 2638 cells/uL (ref 850–3900)
MCH: 34.9 pg — ABNORMAL HIGH (ref 27.0–33.0)
MCHC: 35.9 g/dL (ref 32.0–36.0)
MCV: 97.1 fL (ref 80.0–100.0)
MPV: 10.7 fL (ref 7.5–12.5)
Monocytes Relative: 6.9 %
Neutro Abs: 7379 cells/uL (ref 1500–7800)
Neutrophils Relative %: 67.7 %
Platelets: 240 10*3/uL (ref 140–400)
RBC: 4.79 10*6/uL (ref 4.20–5.80)
RDW: 12.9 % (ref 11.0–15.0)
TOTAL LYMPHOCYTE: 24.2 %
WBC: 10.9 10*3/uL — ABNORMAL HIGH (ref 3.8–10.8)

## 2018-07-31 LAB — LIPID PANEL
Cholesterol: 112 mg/dL (ref <200)
HDL: 30 mg/dL — ABNORMAL LOW (ref >=40)
LDL Cholesterol (Calc): 53 mg/dL (calc)
Non-HDL Cholesterol (Calc): 82 mg/dL (calc) (ref <130)
Total CHOL/HDL Ratio: 3.7 (calc) (ref <5.0)
Triglycerides: 234 mg/dL — ABNORMAL HIGH (ref <150)

## 2018-07-31 LAB — LEVETIRACETAM LEVEL: Keppra (Levetiracetam): 68.8 ug/mL — ABNORMAL HIGH (ref 12.0–46.0)

## 2018-08-07 ENCOUNTER — Telehealth: Payer: Self-pay | Admitting: Diagnostic Neuroimaging

## 2018-08-07 NOTE — Telephone Encounter (Signed)
Pt mother(on DPR) has called asking for a call back to discuss changing up pt's dosage of his levETIRAcetam (KEPPRA) 750 MG tablet due to expected numbers being around 46 and pt's #'s being at 68.  Please call

## 2018-08-07 NOTE — Telephone Encounter (Signed)
Called mother and advised her of Dr Richrd Humbles reply.  She stated she was concerned because he is "groggy and tired", also expressed concnern over patient's kidney function. I advised her Dr Marjory Lies reviewed his labs- kidney function stable, and reviewed Dr Caren Macadam notes; advised she monitor him and call for any sudden changes or new concerns. She would like Dr Marjory Lies to be aware of patient's grogginess. I advised her will let him know of her concern. She agreed to maintain his current dose, said she'd call if she had any more questions, concerns, and verbalized understanding, appreciation of call.

## 2018-08-07 NOTE — Telephone Encounter (Signed)
Continue current medication dosing. Patient is compliant. -VRP

## 2018-08-11 ENCOUNTER — Telehealth: Payer: Self-pay | Admitting: Family Medicine

## 2018-08-11 NOTE — Telephone Encounter (Signed)
Alexander Collier called and states that you had asked if pt was having any symptoms like sleepiness or grogginess with the medication and he told you no not really. Per Alexander Collier he is drinking 3 energy drinks a day to stay awake so he is having symptoms of toxicity with the Keppra. She would like to know if they should decrease his dose?

## 2018-08-12 NOTE — Telephone Encounter (Signed)
I would not. I would defer that to his neurologist.  They need to follow up with him.

## 2018-08-12 NOTE — Telephone Encounter (Signed)
Change keppra to 500 mg, 2 tabs (1000mg ) twice a day

## 2018-08-13 MED ORDER — LEVETIRACETAM 500 MG PO TABS: 1000.0000 mg | ORAL_TABLET | Freq: Two times a day (BID) | ORAL | 3 refills | Status: DC

## 2018-08-13 NOTE — Telephone Encounter (Signed)
Debra informed and med sent to [facility]

## 2018-10-01 ENCOUNTER — Other Ambulatory Visit: Payer: Self-pay | Admitting: Family Medicine

## 2018-10-01 MED ORDER — LEVETIRACETAM 500 MG PO TABS: 1000.0000 mg | ORAL_TABLET | Freq: Two times a day (BID) | ORAL | 3 refills | Status: DC

## 2018-10-01 MED ORDER — ATORVASTATIN CALCIUM 40 MG PO TABS: 40.0000 mg | ORAL_TABLET | Freq: Every day | ORAL | 1 refills | Status: DC

## 2018-10-01 MED ORDER — PROPRANOLOL HCL 20 MG PO TABS: 20.0000 mg | ORAL_TABLET | Freq: Two times a day (BID) | ORAL | 1 refills | Status: DC

## 2018-12-01 ENCOUNTER — Telehealth: Payer: Self-pay | Admitting: Diagnostic Neuroimaging

## 2018-12-01 NOTE — Telephone Encounter (Signed)
Called patient to convert to virtual video visit. Patient states he is unhappy with his provider and felt like Dr. Leta Baptist didn't spend the time with him that was needed. He states he is currently in the process of trying to switch providers. He states he would preferably like to switch to Dr. Jannifer Franklin due to some other family members seeing Dr. Jannifer Franklin.

## 2018-12-02 ENCOUNTER — Ambulatory Visit: Payer: Medicare HMO | Admitting: Diagnostic Neuroimaging

## 2019-01-20 NOTE — Telephone Encounter (Signed)
Called patient to follow-up w/ the switch. Patient states he has had 2 deaths in the family and that his mother hasn't called [place] to make appt w/ Dr. Jannifer Franklin yet. Patient states he will remind his mother to call to schedule appt. W/ Dr. Jannifer Franklin.

## 2019-01-21 ENCOUNTER — Telehealth: Payer: Self-pay | Admitting: Diagnostic Neuroimaging

## 2019-01-21 NOTE — Telephone Encounter (Signed)
Pt mother(on DPR) has called asking that from day one it has been requested that pt be seen by Dr Jannifer Franklin.  Pt has always had the preference of seeing Dr Jannifer Franklin over any other provider in the practice. Mother made aware the request would be submitted

## 2019-01-22 NOTE — Telephone Encounter (Signed)
OK for switch

## 2019-01-23 ENCOUNTER — Other Ambulatory Visit: Payer: Self-pay

## 2019-01-23 DIAGNOSIS — R6889 Other general symptoms and signs: Secondary | ICD-10-CM | POA: Diagnosis not present

## 2019-01-23 DIAGNOSIS — Z20822 Contact with and (suspected) exposure to covid-19: Secondary | ICD-10-CM

## 2019-01-24 LAB — NOVEL CORONAVIRUS, NAA: SARS-CoV-2, NAA: DETECTED — AB

## 2019-01-26 ENCOUNTER — Other Ambulatory Visit: Payer: Self-pay

## 2019-01-26 ENCOUNTER — Ambulatory Visit: Payer: Self-pay

## 2019-01-26 DIAGNOSIS — R6889 Other general symptoms and signs: Secondary | ICD-10-CM | POA: Diagnosis not present

## 2019-01-26 DIAGNOSIS — Z20822 Contact with and (suspected) exposure to covid-19: Secondary | ICD-10-CM

## 2019-01-26 NOTE — Telephone Encounter (Signed)
LVM for mother informing her that Dr Leta Baptist and Dr Jannifer Franklin have agreed to change in providers. I left number and requested she call back to schedule him to see Dr Jannifer Franklin.  The phone staff may schedule.

## 2019-01-26 NOTE — Telephone Encounter (Signed)
Incoming call from Pt. Who states that he dosen't  Understanding how he can be positive for Covid -19and have no Sx states that he plans to be retested.

## 2019-01-27 LAB — NOVEL CORONAVIRUS, NAA: SARS-CoV-2, NAA: DETECTED — AB

## 2019-05-06 NOTE — Telephone Encounter (Signed)
Attempted to reach patient to schedule appt with Dr.Willis (OK per TE) due to VM received   Unable to reach/ unable to LVM

## 2019-07-28 DIAGNOSIS — Z0289 Encounter for other administrative examinations: Secondary | ICD-10-CM

## 2019-08-06 ENCOUNTER — Encounter: Payer: Self-pay | Admitting: Family Medicine

## 2019-08-06 ENCOUNTER — Ambulatory Visit (INDEPENDENT_AMBULATORY_CARE_PROVIDER_SITE_OTHER): Payer: Managed Care, Other (non HMO) | Admitting: Family Medicine

## 2019-08-06 ENCOUNTER — Other Ambulatory Visit: Payer: Self-pay

## 2019-08-06 VITALS — BP 140/78 | HR 78 | Temp 97.2°F | Resp 14 | Ht 67.0 in | Wt 143.0 lb

## 2019-08-06 DIAGNOSIS — N189 Chronic kidney disease, unspecified: Secondary | ICD-10-CM | POA: Diagnosis not present

## 2019-08-06 DIAGNOSIS — G40909 Epilepsy, unspecified, not intractable, without status epilepticus: Secondary | ICD-10-CM | POA: Diagnosis not present

## 2019-08-06 DIAGNOSIS — Z0000 Encounter for general adult medical examination without abnormal findings: Secondary | ICD-10-CM | POA: Diagnosis not present

## 2019-08-06 DIAGNOSIS — E785 Hyperlipidemia, unspecified: Secondary | ICD-10-CM | POA: Diagnosis not present

## 2019-08-06 DIAGNOSIS — I10 Essential (primary) hypertension: Secondary | ICD-10-CM

## 2019-08-06 MED ORDER — DICLOFENAC SODIUM 75 MG PO TBEC: 75.0000 mg | DELAYED_RELEASE_TABLET | Freq: Two times a day (BID) | ORAL | 0 refills | Status: DC
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
## 2019-08-10 LAB — CBC WITH DIFFERENTIAL/PLATELET
Absolute Monocytes: 500 cells/uL (ref 200–950)
Basophils Absolute: 31 cells/uL (ref 0–200)
Basophils Relative: 0.5 %
Eosinophils Absolute: 153 cells/uL (ref 15–500)
Eosinophils Relative: 2.5 %
HCT: 45.7 % (ref 38.5–50.0)
Hemoglobin: 15.7 g/dL (ref 13.2–17.1)
Lymphs Abs: 1909 cells/uL (ref 850–3900)
MCH: 33.7 pg — ABNORMAL HIGH (ref 27.0–33.0)
MCHC: 34.4 g/dL (ref 32.0–36.0)
MCV: 98.1 fL (ref 80.0–100.0)
MPV: 10.7 fL (ref 7.5–12.5)
Monocytes Relative: 8.2 %
Neutro Abs: 3508 cells/uL (ref 1500–7800)
Neutrophils Relative %: 57.5 %
Platelets: 223 10*3/uL (ref 140–400)
RBC: 4.66 10*6/uL (ref 4.20–5.80)
RDW: 12 % (ref 11.0–15.0)
Total Lymphocyte: 31.3 %
WBC: 6.1 10*3/uL (ref 3.8–10.8)

## 2019-08-10 LAB — COMPLETE METABOLIC PANEL WITH GFR
AG Ratio: 1.7 (calc) (ref 1.0–2.5)
ALT: 13 U/L (ref 9–46)
AST: 15 U/L (ref 10–40)
Albumin: 4.3 g/dL (ref 3.6–5.1)
Alkaline phosphatase (APISO): 88 U/L (ref 36–130)
BUN: 17 mg/dL (ref 7–25)
CO2: 27 mmol/L (ref 20–32)
Calcium: 9 mg/dL (ref 8.6–10.3)
Chloride: 104 mmol/L (ref 98–110)
Creat: 1.16 mg/dL (ref 0.60–1.35)
GFR, Est African American: 87 mL/min/{1.73_m2} (ref >=60)
GFR, Est Non African American: 75 mL/min/{1.73_m2} (ref >=60)
Globulin: 2.5 g/dL (calc) (ref 1.9–3.7)
Glucose, Bld: 76 mg/dL (ref 65–99)
Potassium: 3.8 mmol/L (ref 3.5–5.3)
Sodium: 140 mmol/L (ref 135–146)
Total Bilirubin: 0.3 mg/dL (ref 0.2–1.2)
Total Protein: 6.8 g/dL (ref 6.1–8.1)

## 2019-08-10 LAB — CARBAMAZEPINE LEVEL, TOTAL: Carbamazepine Lvl: 4.9 mg/L (ref 4.0–12.0)

## 2019-08-10 LAB — LIPID PANEL
Cholesterol: 156 mg/dL (ref <200)
HDL: 39 mg/dL — ABNORMAL LOW (ref >=40)
LDL Cholesterol (Calc): 89 mg/dL (calc)
Non-HDL Cholesterol (Calc): 117 mg/dL (calc) (ref <130)
Total CHOL/HDL Ratio: 4 (calc) (ref <5.0)
Triglycerides: 190 mg/dL — ABNORMAL HIGH (ref <150)

## 2019-08-10 LAB — LEVETIRACETAM LEVEL: Keppra (Levetiracetam): 31.9 ug/mL (ref 12.0–46.0)

## 2019-08-17 ENCOUNTER — Encounter: Payer: Self-pay | Admitting: Family Medicine

## 2019-10-15 ENCOUNTER — Ambulatory Visit: Payer: Managed Care, Other (non HMO) | Admitting: Neurology

## 2019-10-15 ENCOUNTER — Other Ambulatory Visit: Payer: Self-pay

## 2019-10-15 ENCOUNTER — Encounter: Payer: Self-pay | Admitting: Neurology

## 2019-10-15 VITALS — BP 135/88 | HR 68 | Temp 97.1°F | Ht 67.0 in | Wt 145.0 lb

## 2019-10-15 DIAGNOSIS — R569 Unspecified convulsions: Secondary | ICD-10-CM

## 2019-10-15 MED ORDER — CARBAMAZEPINE 200 MG PO TABS: 200.0000 mg | ORAL_TABLET | Freq: Two times a day (BID) | ORAL | 3 refills | Status: DC

## 2019-10-15 MED ORDER — LEVETIRACETAM 500 MG PO TABS: 500.0000 mg | ORAL_TABLET | Freq: Two times a day (BID) | ORAL | 3 refills | Status: DC
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
## 2019-10-31 ENCOUNTER — Other Ambulatory Visit: Payer: Self-pay | Admitting: Family Medicine

## 2019-11-03 NOTE — Telephone Encounter (Signed)
Last refilled:  08/06/2019 Last office visit: 03/0/2021

## 2020-01-18 IMAGING — DX DG CHEST 2V
2 series · 2 of 2 positions shown · non-contrast
Comparison: Radiograph 08/01/2017

CLINICAL DATA: Chest pain and shortness of breath.

EXAM:
CHEST - 2 VIEW

[chest ap]
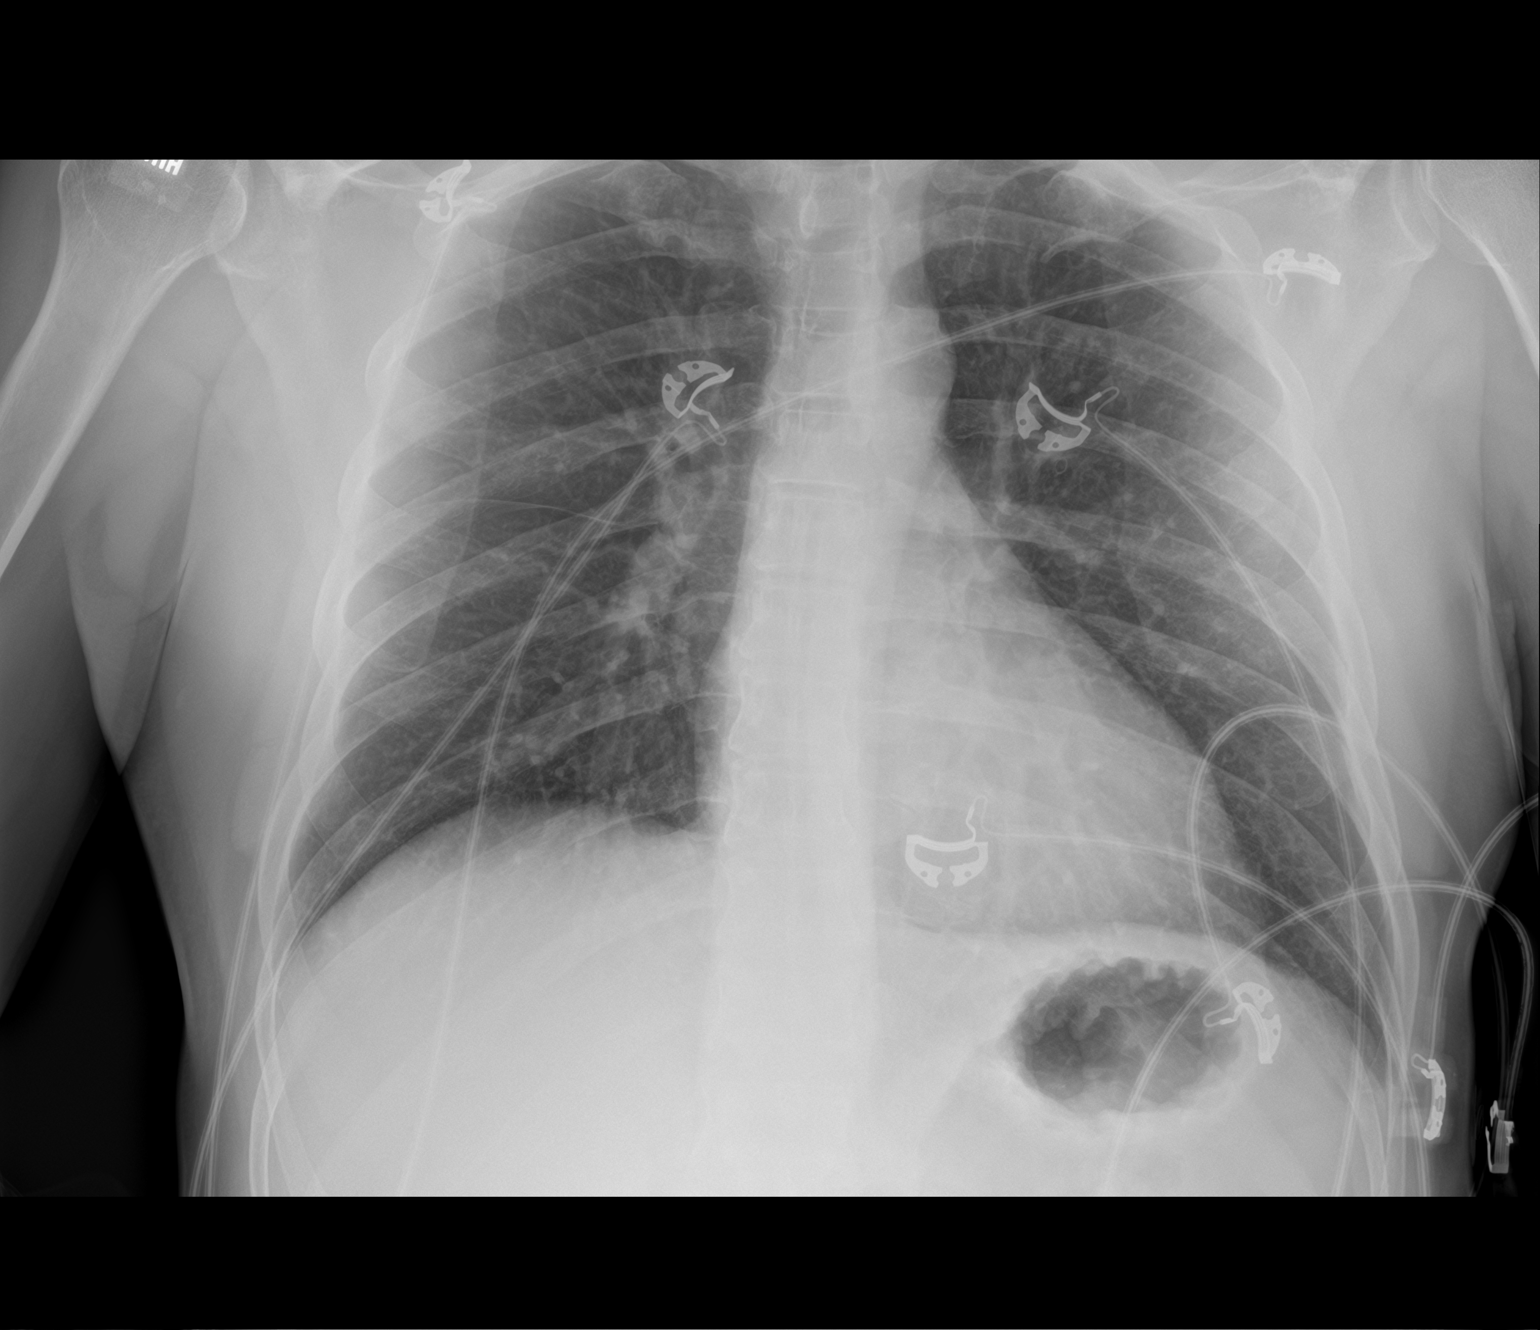

[chest lat]
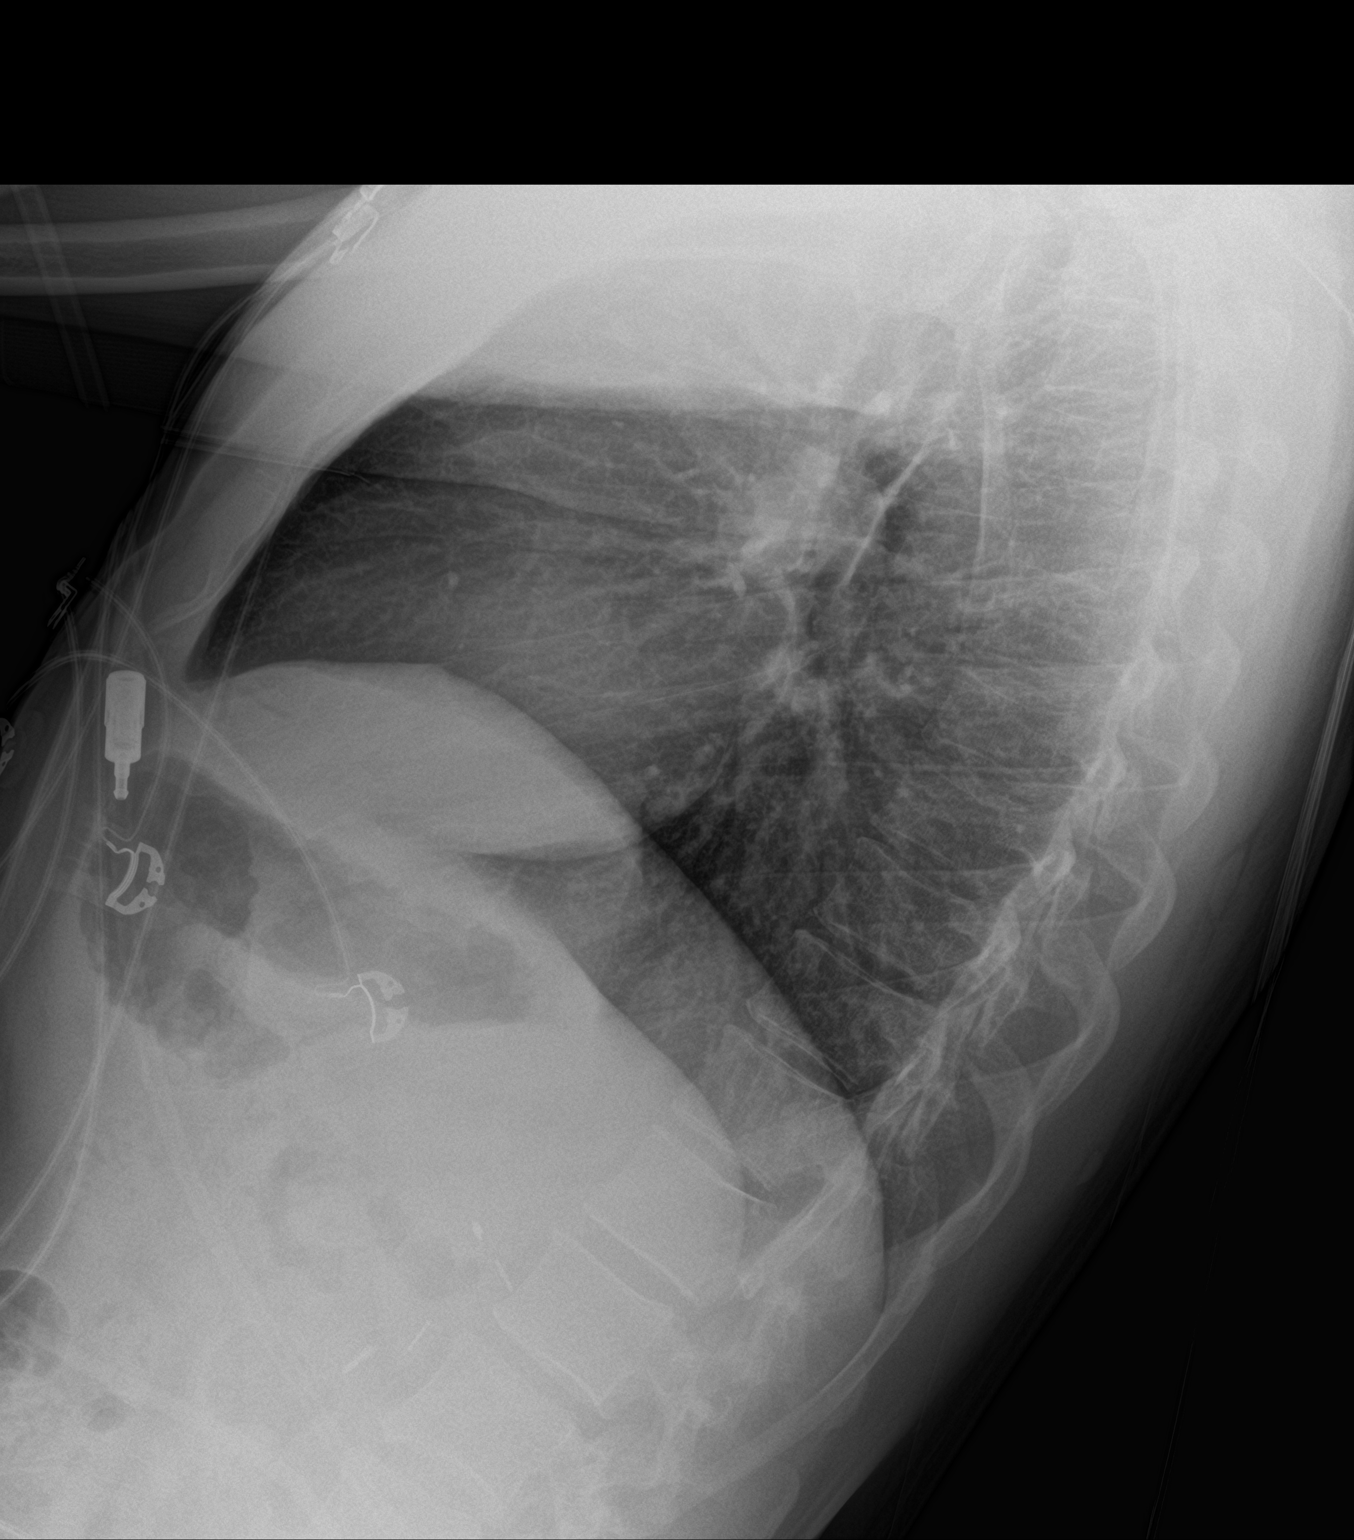

[2 of 2 positions shown; findings below may reference images not displayed]

FINDINGS: The cardiomediastinal contours are normal. The lungs are clear.
Pulmonary vasculature is normal. No consolidation, pleural effusion,
or pneumothorax. No acute osseous abnormalities are seen.
IMPRESSION: No acute pulmonary process.

## 2020-02-25 ENCOUNTER — Ambulatory Visit: Payer: Managed Care, Other (non HMO) | Admitting: Family Medicine

## 2020-02-25 ENCOUNTER — Other Ambulatory Visit: Payer: Self-pay

## 2020-02-25 VITALS — BP 120/90 | HR 87 | Temp 97.7°F | Ht 67.0 in | Wt 141.0 lb

## 2020-02-25 DIAGNOSIS — M25561 Pain in right knee: Secondary | ICD-10-CM

## 2020-02-25 LAB — BASIC METABOLIC PANEL WITH GFR
BUN/Creatinine Ratio: 12 (calc) (ref 6–22)
BUN: 16 mg/dL (ref 7–25)
CO2: 27 mmol/L (ref 20–32)
Calcium: 9.2 mg/dL (ref 8.6–10.3)
Chloride: 102 mmol/L (ref 98–110)
Creat: 1.38 mg/dL — ABNORMAL HIGH (ref 0.60–1.35)
GFR, Est African American: 71 mL/min/{1.73_m2} (ref >=60)
GFR, Est Non African American: 61 mL/min/{1.73_m2} (ref >=60)
Glucose, Bld: 62 mg/dL — ABNORMAL LOW (ref 65–99)
Potassium: 4.1 mmol/L (ref 3.5–5.3)
Sodium: 140 mmol/L (ref 135–146)

## 2020-02-25 MED ORDER — DICLOFENAC SODIUM 75 MG PO TBEC: 75.0000 mg | DELAYED_RELEASE_TABLET | Freq: Two times a day (BID) | ORAL | 1 refills | Status: DC
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
## 2020-02-26 ENCOUNTER — Other Ambulatory Visit: Payer: Self-pay | Admitting: Family Medicine

## 2020-09-01 ENCOUNTER — Ambulatory Visit
Admission: EM | Admit: 2020-09-01 | Discharge: 2020-09-01 | Disposition: A | Discharge status: Home / Self Care | Payer: Managed Care, Other (non HMO) | Attending: Internal Medicine | Admitting: Internal Medicine

## 2020-09-01 ENCOUNTER — Encounter: Payer: Self-pay | Admitting: Emergency Medicine

## 2020-09-01 ENCOUNTER — Other Ambulatory Visit: Payer: Self-pay

## 2020-09-01 DIAGNOSIS — J309 Allergic rhinitis, unspecified: Secondary | ICD-10-CM

## 2020-09-01 LAB — POCT INFLUENZA A/B
Influenza A, POC: NEGATIVE
Influenza B, POC: NEGATIVE

## 2020-09-01 MED ORDER — FEXOFENADINE HCL 180 MG PO TABS: 180.0000 mg | ORAL_TABLET | Freq: Every day | ORAL | 0 refills | Status: DC

## 2020-09-01 MED ORDER — BENZONATATE 200 MG PO CAPS: 200.0000 mg | ORAL_CAPSULE | Freq: Two times a day (BID) | ORAL | 0 refills | Status: DC | PRN

## 2020-09-01 MED ORDER — FLUTICASONE PROPIONATE 50 MCG/ACT NA SUSP: 1.0000 | Freq: Every day | NASAL | 0 refills | Status: DC
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
## 2020-09-01 NOTE — Discharge Instructions (Addendum)
Your exam shows signs of allergies causing your stuffy nose. Because of your seizure medication you cant take Prednisone with it, so I will have you try Flonase  for the stuffy nose and Tessalon for the cough. The allegra is for the sneezing and runny nose You do not have a bacterial infection per my exam and your flu test was negative

## 2020-09-01 NOTE — ED Triage Notes (Signed)
Sinus, chest congestion and cough since Tuesday night.

## 2020-09-28 ENCOUNTER — Other Ambulatory Visit: Payer: Self-pay | Admitting: Neurology

## 2020-10-10 ENCOUNTER — Ambulatory Visit: Payer: Managed Care, Other (non HMO) | Admitting: Neurology

## 2020-10-10 ENCOUNTER — Encounter: Payer: Self-pay | Admitting: Neurology

## 2020-10-10 ENCOUNTER — Telehealth: Payer: Self-pay | Admitting: Neurology

## 2020-10-10 NOTE — Telephone Encounter (Signed)
This patient did not show for a revisit appointment today. 

## 2020-10-17 ENCOUNTER — Telehealth: Payer: Self-pay | Admitting: Neurology

## 2020-10-17 DIAGNOSIS — Z5181 Encounter for therapeutic drug level monitoring: Secondary | ICD-10-CM

## 2020-10-17 NOTE — Telephone Encounter (Signed)
Pt's wife, Jah Alarid (on [place]) called, contacting to discuss getting labs to check his levels. Would like a call from the nurse

## 2020-10-17 NOTE — Telephone Encounter (Signed)
I called and talk with the wife.  The patient missed his RV appointment last week.  He apparently had a seizure today.  We will have him come in for blood work and check levels of his carbamazepine and Keppra.  We will need to get a revisit appointment set up for him.  The patient had apparently had a cold, he was taking large amounts of over-the-counter cold medications that likely included pseudoephedrine and antihistamines that may have lowered the seizure threshold.  He did not miss any doses of medications.  We will get a revisit set up for him.

## 2020-10-18 ENCOUNTER — Encounter: Payer: Self-pay | Admitting: Neurology

## 2020-10-18 ENCOUNTER — Other Ambulatory Visit (INDEPENDENT_AMBULATORY_CARE_PROVIDER_SITE_OTHER): Payer: Self-pay

## 2020-10-18 DIAGNOSIS — Z0289 Encounter for other administrative examinations: Secondary | ICD-10-CM

## 2020-10-18 DIAGNOSIS — Z5181 Encounter for therapeutic drug level monitoring: Secondary | ICD-10-CM

## 2020-10-18 NOTE — Telephone Encounter (Signed)
Reached patient by phone, rescheduled missed appointment as requested by Dr. Anne Hahn.  Patient accepted 10/27/20 @ 1015 with arrival time of 945 with Maralyn Sago, NP.  Patient denied further questions, verbalized understanding and expressed appreciation for the phone call.

## 2020-10-21 ENCOUNTER — Telehealth: Payer: Self-pay | Admitting: Neurology

## 2020-10-21 LAB — COMPREHENSIVE METABOLIC PANEL
ALT: 18 IU/L (ref 0–44)
AST: 41 IU/L — ABNORMAL HIGH (ref 0–40)
Albumin/Globulin Ratio: 1.7 (ref 1.2–2.2)
Albumin: 4.7 g/dL (ref 4.0–5.0)
Alkaline Phosphatase: 104 IU/L (ref 44–121)
BUN/Creatinine Ratio: 8 — ABNORMAL LOW (ref 9–20)
BUN: 10 mg/dL (ref 6–24)
Bilirubin Total: 0.3 mg/dL (ref 0.0–1.2)
CO2: 21 mmol/L (ref 20–29)
Calcium: 9.1 mg/dL (ref 8.7–10.2)
Chloride: 103 mmol/L (ref 96–106)
Creatinine, Ser: 1.18 mg/dL (ref 0.76–1.27)
Globulin, Total: 2.7 g/dL (ref 1.5–4.5)
Glucose: 110 mg/dL — ABNORMAL HIGH (ref 65–99)
Potassium: 3.9 mmol/L (ref 3.5–5.2)
Sodium: 143 mmol/L (ref 134–144)
Total Protein: 7.4 g/dL (ref 6.0–8.5)
eGFR: 77 mL/min/{1.73_m2} (ref >59)

## 2020-10-21 LAB — CBC WITH DIFFERENTIAL/PLATELET
Basophils Absolute: 0 10*3/uL (ref 0.0–0.2)
Basos: 1 %
EOS (ABSOLUTE): 0.2 10*3/uL (ref 0.0–0.4)
Eos: 2 %
Hematocrit: 46.8 % (ref 37.5–51.0)
Hemoglobin: 16.1 g/dL (ref 13.0–17.7)
Immature Grans (Abs): 0 10*3/uL (ref 0.0–0.1)
Immature Granulocytes: 1 %
Lymphocytes Absolute: 2 10*3/uL (ref 0.7–3.1)
Lymphs: 23 %
MCH: 34 pg — ABNORMAL HIGH (ref 26.6–33.0)
MCHC: 34.4 g/dL (ref 31.5–35.7)
MCV: 99 fL — ABNORMAL HIGH (ref 79–97)
Monocytes Absolute: 0.6 10*3/uL (ref 0.1–0.9)
Monocytes: 7 %
Neutrophils Absolute: 5.9 10*3/uL (ref 1.4–7.0)
Neutrophils: 66 %
Platelets: 230 10*3/uL (ref 150–450)
RBC: 4.74 x10E6/uL (ref 4.14–5.80)
RDW: 12.2 % (ref 11.6–15.4)
WBC: 8.7 10*3/uL (ref 3.4–10.8)

## 2020-10-21 LAB — CARBAMAZEPINE LEVEL, TOTAL: Carbamazepine (Tegretol), S: <2 ug/mL — ABNORMAL LOW (ref 4.0–12.0)

## 2020-10-21 LAB — LEVETIRACETAM LEVEL: Levetiracetam Lvl: 35.8 ug/mL (ref 10.0–40.0)

## 2020-10-21 MED ORDER — CARBAMAZEPINE 200 MG PO TABS: 200.0000 mg | ORAL_TABLET | Freq: Two times a day (BID) | ORAL | 0 refills | Status: DC

## 2020-10-21 NOTE — Telephone Encounter (Signed)
I called the patient.  The blood work was relatively unremarkable, minimal elevation in ALT level, carbamazepine level was essentially 0, therapeutic Keppra level.  Upon questioning, the patient ran out of his carbamazepine 2 weeks ago, this is the likely source of his seizure.  I sent in another prescription for his carbamazepine.

## 2020-10-27 ENCOUNTER — Ambulatory Visit: Payer: Self-pay | Admitting: Neurology

## 2021-01-04 ENCOUNTER — Other Ambulatory Visit: Payer: Self-pay | Admitting: Neurology

## 2021-02-03 ENCOUNTER — Other Ambulatory Visit: Payer: Self-pay

## 2021-02-07 ENCOUNTER — Other Ambulatory Visit: Payer: Self-pay

## 2021-02-07 ENCOUNTER — Encounter: Payer: Self-pay | Admitting: *Deleted

## 2021-02-07 MED ORDER — CARBAMAZEPINE 200 MG PO TABS
200.0000 mg | ORAL_TABLET | Freq: Two times a day (BID) | ORAL | 0 refills | Status: DC
  Filled 2021-02-07: qty 60, 30d supply, fill #0

## 2021-02-08 ENCOUNTER — Other Ambulatory Visit: Payer: Self-pay

## 2021-02-09 ENCOUNTER — Other Ambulatory Visit: Payer: Self-pay

## 2021-02-09 ENCOUNTER — Telehealth: Payer: Self-pay | Admitting: Neurology

## 2021-02-09 MED ORDER — LEVETIRACETAM 500 MG PO TABS
500.0000 mg | ORAL_TABLET | Freq: Two times a day (BID) | ORAL | 0 refills | Status: DC
Start: 2021-02-09 — End: 2021-04-06

## 2021-02-09 MED ORDER — CARBAMAZEPINE 200 MG PO TABS: 200.0000 mg | ORAL_TABLET | Freq: Two times a day (BID) | ORAL | 0 refills | Status: DC

## 2021-02-09 NOTE — Telephone Encounter (Signed)
Pt called requesting refill for levETIRAcetam (KEPPRA) 500 MG tablet and carbamazepine (TEGRETOL) 200 MG tablet. Pharmacy [facility] [id number].

## 2021-02-09 NOTE — Telephone Encounter (Signed)
approved

## 2021-02-10 ENCOUNTER — Other Ambulatory Visit: Payer: Self-pay

## 2021-04-06 ENCOUNTER — Other Ambulatory Visit: Payer: Self-pay | Admitting: Neurology

## 2021-04-18 ENCOUNTER — Encounter: Payer: Self-pay | Admitting: Family Medicine

## 2021-04-18 ENCOUNTER — Ambulatory Visit: Payer: Managed Care, Other (non HMO) | Admitting: Family Medicine

## 2021-04-18 ENCOUNTER — Ambulatory Visit (HOSPITAL_COMMUNITY)
Admission: RE | Admit: 2021-04-18 | Discharge: 2021-04-18 | Disposition: A | Discharge status: Home / Self Care | Payer: Managed Care, Other (non HMO) | Source: Ambulatory Visit | Attending: Family Medicine | Admitting: Family Medicine

## 2021-04-18 ENCOUNTER — Other Ambulatory Visit: Payer: Self-pay

## 2021-04-18 VITALS — BP 122/92 | HR 73 | Temp 97.8°F | Resp 18 | Ht 67.0 in | Wt 150.0 lb

## 2021-04-18 DIAGNOSIS — M25562 Pain in left knee: Secondary | ICD-10-CM | POA: Insufficient documentation
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
## 2021-05-22 ENCOUNTER — Telehealth: Payer: Self-pay | Admitting: Neurology

## 2021-05-22 MED ORDER — CARBAMAZEPINE 200 MG PO TABS: 200.0000 mg | ORAL_TABLET | Freq: Two times a day (BID) | ORAL | 0 refills | Status: DC

## 2021-05-22 NOTE — Telephone Encounter (Signed)
I called pt back and left vm ( ok per dpr) advising med has been sent and to keep f/u as scheduled for Feb for further refills.

## 2021-05-22 NOTE — Telephone Encounter (Signed)
Pt's wife, Samul Mcinroy request refill for carbamazepine (TEGRETOL) 200 MG tablet at [facility] [phone]. Was told by the pharmacy refill was declined. Would like a call from the nurse to confirm refill has been sent.   Sending to the work in physician, Dr. Terrace Arabia

## 2021-05-22 NOTE — Addendum Note (Signed)
Addended by: Ann Maki on: 05/22/2021 03:29 PM   Modules accepted: Orders

## 2021-07-10 ENCOUNTER — Ambulatory Visit: Payer: Managed Care, Other (non HMO) | Admitting: Neurology
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
## 2021-07-11 ENCOUNTER — Ambulatory Visit: Payer: Managed Care, Other (non HMO) | Admitting: Neurology

## 2021-07-11 ENCOUNTER — Other Ambulatory Visit: Payer: Self-pay

## 2021-07-11 ENCOUNTER — Encounter: Payer: Self-pay | Admitting: Neurology

## 2021-07-11 VITALS — BP 143/90 | HR 77 | Ht 67.0 in | Wt 150.0 lb

## 2021-07-11 DIAGNOSIS — R569 Unspecified convulsions: Secondary | ICD-10-CM | POA: Diagnosis not present

## 2021-07-11 MED ORDER — CARBAMAZEPINE 200 MG PO TABS: 200.0000 mg | ORAL_TABLET | Freq: Two times a day (BID) | ORAL | 3 refills | Status: DC

## 2021-07-11 MED ORDER — LEVETIRACETAM 500 MG PO TABS: 500.0000 mg | ORAL_TABLET | Freq: Two times a day (BID) | ORAL | 3 refills | Status: DC

## 2021-07-11 NOTE — Patient Instructions (Signed)
Great to see you today Continue current medications Make sure not to miss any medications  Check labs today  Call for seizures Return back in 1 year

## 2021-07-12 LAB — COMPREHENSIVE METABOLIC PANEL
ALT: 16 IU/L (ref 0–44)
AST: 17 IU/L (ref 0–40)
Albumin/Globulin Ratio: 1.8 (ref 1.2–2.2)
Albumin: 4.9 g/dL (ref 4.0–5.0)
Alkaline Phosphatase: 93 IU/L (ref 44–121)
BUN/Creatinine Ratio: 9 (ref 9–20)
BUN: 11 mg/dL (ref 6–24)
Bilirubin Total: 0.2 mg/dL (ref 0.0–1.2)
CO2: 25 mmol/L (ref 20–29)
Calcium: 9.5 mg/dL (ref 8.7–10.2)
Chloride: 100 mmol/L (ref 96–106)
Creatinine, Ser: 1.2 mg/dL (ref 0.76–1.27)
Globulin, Total: 2.7 g/dL (ref 1.5–4.5)
Glucose: 79 mg/dL (ref 70–99)
Potassium: 4.5 mmol/L (ref 3.5–5.2)
Sodium: 141 mmol/L (ref 134–144)
Total Protein: 7.6 g/dL (ref 6.0–8.5)
eGFR: 75 mL/min/{1.73_m2} (ref >59)

## 2021-07-12 LAB — CBC WITH DIFFERENTIAL/PLATELET
Basophils Absolute: 0 10*3/uL (ref 0.0–0.2)
Basos: 0 %
EOS (ABSOLUTE): 0.1 10*3/uL (ref 0.0–0.4)
Eos: 1 %
Hematocrit: 46.9 % (ref 37.5–51.0)
Hemoglobin: 16.6 g/dL (ref 13.0–17.7)
Immature Grans (Abs): 0 10*3/uL (ref 0.0–0.1)
Immature Granulocytes: 0 %
Lymphocytes Absolute: 1.2 10*3/uL (ref 0.7–3.1)
Lymphs: 17 %
MCH: 34.4 pg — ABNORMAL HIGH (ref 26.6–33.0)
MCHC: 35.4 g/dL (ref 31.5–35.7)
MCV: 97 fL (ref 79–97)
Monocytes Absolute: 0.4 10*3/uL (ref 0.1–0.9)
Monocytes: 6 %
Neutrophils Absolute: 5.6 10*3/uL (ref 1.4–7.0)
Neutrophils: 76 %
Platelets: 214 10*3/uL (ref 150–450)
RBC: 4.83 x10E6/uL (ref 4.14–5.80)
RDW: 12.2 % (ref 11.6–15.4)
WBC: 7.4 10*3/uL (ref 3.4–10.8)

## 2021-07-12 LAB — CARBAMAZEPINE LEVEL, TOTAL: Carbamazepine (Tegretol), S: 14.2 ug/mL (ref 4.0–12.0)

## 2021-07-12 LAB — LEVETIRACETAM LEVEL: Levetiracetam Lvl: 29 ug/mL (ref 10.0–40.0)

## 2021-07-13 ENCOUNTER — Telehealth: Payer: Self-pay | Admitting: *Deleted

## 2021-07-13 DIAGNOSIS — R569 Unspecified convulsions: Secondary | ICD-10-CM

## 2021-07-13 DIAGNOSIS — Z79899 Other long term (current) drug therapy: Secondary | ICD-10-CM

## 2021-07-13 NOTE — Telephone Encounter (Addendum)
I spoke to the patient. He took his morning dose of carbamazepine at 11am the day his labs were drawn around 3:30pm. He is aware Maralyn Sago would like to repeat the labs.  He will come in next week for a trough level. Understands to hold his morning dose until after the labs are drawn. Provided our office hours. New orders placed in Epic.

## 2021-07-13 NOTE — Telephone Encounter (Signed)
-----   Message from Glean Salvo, NP sent at 07/13/2021  8:23 AM EST ----- I sent patient a my chart note but haven't heard back, carbamazepine level was elevated at 14.2, I would recommend a trough level to have a good baseline. CBC and CMP are unremarkable. Keppra level is within range at 29.0.

## 2021-07-13 NOTE — Addendum Note (Signed)
Addended by: Lindell Spar C on: 07/13/2021 10:58 AM   Modules accepted: Orders

## 2021-07-17 ENCOUNTER — Other Ambulatory Visit (INDEPENDENT_AMBULATORY_CARE_PROVIDER_SITE_OTHER): Payer: Self-pay

## 2021-07-17 DIAGNOSIS — Z79899 Other long term (current) drug therapy: Secondary | ICD-10-CM

## 2021-07-17 DIAGNOSIS — Z0289 Encounter for other administrative examinations: Secondary | ICD-10-CM

## 2021-07-17 DIAGNOSIS — R569 Unspecified convulsions: Secondary | ICD-10-CM

## 2021-07-18 LAB — CARBAMAZEPINE LEVEL, TOTAL: Carbamazepine (Tegretol), S: 8.6 ug/mL (ref 4.0–12.0)

## 2021-08-30 ENCOUNTER — Other Ambulatory Visit: Payer: Self-pay

## 2021-08-30 ENCOUNTER — Encounter: Payer: Self-pay | Admitting: Emergency Medicine

## 2021-08-30 ENCOUNTER — Ambulatory Visit
Admission: EM | Admit: 2021-08-30 | Discharge: 2021-08-30 | Disposition: A | Discharge status: Home / Self Care | Payer: Managed Care, Other (non HMO) | Attending: Family Medicine | Admitting: Family Medicine

## 2021-08-30 DIAGNOSIS — J029 Acute pharyngitis, unspecified: Secondary | ICD-10-CM

## 2021-08-30 LAB — POCT RAPID STREP A (OFFICE): Rapid Strep A Screen: NEGATIVE

## 2021-08-30 MED ORDER — LIDOCAINE VISCOUS HCL 2 % MT SOLN: 10.0000 mL | OROMUCOSAL | 0 refills | Status: DC | PRN

## 2021-08-30 MED ORDER — AMOXICILLIN 875 MG PO TABS: 875.0000 mg | ORAL_TABLET | Freq: Two times a day (BID) | ORAL | 0 refills | Status: DC
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
## 2021-08-30 NOTE — ED Triage Notes (Signed)
Sore throat since yesterday.

## 2021-09-02 LAB — CULTURE, GROUP A STREP (THRC)

## 2021-09-26 ENCOUNTER — Ambulatory Visit (INDEPENDENT_AMBULATORY_CARE_PROVIDER_SITE_OTHER): Payer: Managed Care, Other (non HMO) | Admitting: Family Medicine

## 2021-09-26 VITALS — BP 124/76 | HR 86 | Temp 98.2°F | Ht 67.0 in | Wt 148.0 lb

## 2021-09-26 DIAGNOSIS — M25561 Pain in right knee: Secondary | ICD-10-CM

## 2021-09-26 DIAGNOSIS — N182 Chronic kidney disease, stage 2 (mild): Secondary | ICD-10-CM | POA: Diagnosis not present

## 2021-09-26 DIAGNOSIS — Z23 Encounter for immunization: Secondary | ICD-10-CM | POA: Diagnosis not present

## 2021-09-26 DIAGNOSIS — I10 Essential (primary) hypertension: Secondary | ICD-10-CM

## 2021-09-26 DIAGNOSIS — Z0000 Encounter for general adult medical examination without abnormal findings: Secondary | ICD-10-CM

## 2021-09-26 DIAGNOSIS — M25562 Pain in left knee: Secondary | ICD-10-CM

## 2021-09-26 DIAGNOSIS — Z0001 Encounter for general adult medical examination with abnormal findings: Secondary | ICD-10-CM | POA: Diagnosis not present

## 2021-09-26 DIAGNOSIS — R569 Unspecified convulsions: Secondary | ICD-10-CM

## 2021-09-26 NOTE — Addendum Note (Signed)
Addended by: Jenna Luo T on: 09/26/2021 03:30 PM ? ? Modules accepted: Orders ? ?
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
## 2021-09-27 LAB — LIPID PANEL
Cholesterol: 166 mg/dL (ref <200)
HDL: 38 mg/dL — ABNORMAL LOW (ref >=40)
LDL Cholesterol (Calc): 85 mg/dL (calc)
Non-HDL Cholesterol (Calc): 128 mg/dL (calc) (ref <130)
Total CHOL/HDL Ratio: 4.4 (calc) (ref <5.0)
Triglycerides: 359 mg/dL — ABNORMAL HIGH (ref <150)

## 2021-09-27 NOTE — Addendum Note (Signed)
Addended by: Colman Cater on: 09/27/2021 09:04 AM ? ? Modules accepted: Orders ? ?

## 2021-12-02 ENCOUNTER — Ambulatory Visit
Admission: EM | Admit: 2021-12-02 | Discharge: 2021-12-02 | Disposition: A | Discharge status: Home / Self Care | Payer: Managed Care, Other (non HMO) | Attending: Family Medicine | Admitting: Family Medicine

## 2021-12-02 DIAGNOSIS — R0789 Other chest pain: Secondary | ICD-10-CM | POA: Diagnosis not present

## 2021-12-02 DIAGNOSIS — R062 Wheezing: Secondary | ICD-10-CM

## 2021-12-02 DIAGNOSIS — J069 Acute upper respiratory infection, unspecified: Secondary | ICD-10-CM | POA: Diagnosis not present

## 2021-12-02 MED ORDER — ALBUTEROL SULFATE HFA 108 (90 BASE) MCG/ACT IN AERS: 1.0000 | INHALATION_SPRAY | Freq: Four times a day (QID) | RESPIRATORY_TRACT | 0 refills | Status: DC | PRN

## 2021-12-02 MED ORDER — PREDNISONE 20 MG PO TABS: 40.0000 mg | ORAL_TABLET | Freq: Every day | ORAL | 0 refills | Status: DC

## 2021-12-02 MED ORDER — FLUTICASONE PROPIONATE 50 MCG/ACT NA SUSP: 1.0000 | Freq: Two times a day (BID) | NASAL | 2 refills | Status: DC

## 2021-12-02 MED ORDER — PROMETHAZINE-DM 6.25-15 MG/5ML PO SYRP: 5.0000 mL | ORAL_SOLUTION | Freq: Four times a day (QID) | ORAL | 0 refills | Status: DC | PRN

## 2021-12-02 NOTE — ED Triage Notes (Signed)
Pt reports, sinus pressure, nasal congestion  and sneezing x 3 days. Pt states he has a sisnu infection as his niece has a sinus infection.
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
## 2021-12-03 LAB — NOVEL CORONAVIRUS, NAA: SARS-CoV-2, NAA: NOT DETECTED

## 2022-07-17 ENCOUNTER — Ambulatory Visit: Payer: Managed Care, Other (non HMO) | Admitting: Neurology

## 2022-07-17 ENCOUNTER — Other Ambulatory Visit: Payer: Self-pay | Admitting: Neurology

## 2022-07-21 ENCOUNTER — Ambulatory Visit: Payer: Self-pay

## 2022-07-21 ENCOUNTER — Ambulatory Visit
Admission: RE | Admit: 2022-07-21 | Discharge: 2022-07-21 | Disposition: A | Discharge status: Home / Self Care | Payer: Managed Care, Other (non HMO) | Source: Ambulatory Visit | Attending: Nurse Practitioner | Admitting: Nurse Practitioner

## 2022-07-21 VITALS — BP 133/89 | HR 70 | Temp 98.0°F | Resp 18

## 2022-07-21 DIAGNOSIS — S61211A Laceration without foreign body of left index finger without damage to nail, initial encounter: Secondary | ICD-10-CM

## 2022-07-21 NOTE — ED Triage Notes (Signed)
Cut left index finger on Thursday while cutting a box open with a razor blade.  States last tetanus was less than 5 years ago.

## 2022-07-21 NOTE — Discharge Instructions (Addendum)
Dermabond was used to repair the laceration to your left index finger. Keep the dressing in place for 24 hours. Remove the dressing and clean the area with warm water in 24 hours.  When you are at home, may leave the area open to air.  When you are out, recommend keeping the area covered. May apply Neosporin to the laceration as needed. May take over-the-counter ibuprofen or Tylenol for pain or discomfort. Do not pick or disrupt the glue that was applied today.  It will fall off on its own. Follow-up immediately if you develop swelling, increased redness that goes into the hand, fingers or up the arm, drainage, or if you develop fever, chills, or other concerns. Follow-up as needed.
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
## 2022-07-25 ENCOUNTER — Ambulatory Visit: Payer: Managed Care, Other (non HMO) | Admitting: Neurology

## 2022-07-25 ENCOUNTER — Encounter: Payer: Self-pay | Admitting: Neurology

## 2022-07-25 VITALS — BP 136/89 | HR 63 | Ht 68.0 in | Wt 154.0 lb

## 2022-07-25 DIAGNOSIS — R569 Unspecified convulsions: Secondary | ICD-10-CM

## 2022-07-25 MED ORDER — LEVETIRACETAM 500 MG PO TABS: 500.0000 mg | ORAL_TABLET | Freq: Two times a day (BID) | ORAL | 3 refills | Status: DC

## 2022-07-25 MED ORDER — CARBAMAZEPINE 200 MG PO TABS: 200.0000 mg | ORAL_TABLET | Freq: Two times a day (BID) | ORAL | 3 refills | Status: DC

## 2022-07-26 LAB — CBC WITH DIFFERENTIAL/PLATELET
Basophils Absolute: 0 10*3/uL (ref 0.0–0.2)
Basos: 1 %
EOS (ABSOLUTE): 0.1 10*3/uL (ref 0.0–0.4)
Eos: 2 %
Hematocrit: 43.6 % (ref 37.5–51.0)
Hemoglobin: 15.2 g/dL (ref 13.0–17.7)
Immature Grans (Abs): 0 10*3/uL (ref 0.0–0.1)
Immature Granulocytes: 0 %
Lymphocytes Absolute: 1.6 10*3/uL (ref 0.7–3.1)
Lymphs: 34 %
MCH: 34.4 pg — ABNORMAL HIGH (ref 26.6–33.0)
MCHC: 34.9 g/dL (ref 31.5–35.7)
MCV: 99 fL — ABNORMAL HIGH (ref 79–97)
Monocytes Absolute: 0.3 10*3/uL (ref 0.1–0.9)
Monocytes: 7 %
Neutrophils Absolute: 2.6 10*3/uL (ref 1.4–7.0)
Neutrophils: 56 %
Platelets: 231 10*3/uL (ref 150–450)
RBC: 4.42 x10E6/uL (ref 4.14–5.80)
RDW: 12.7 % (ref 11.6–15.4)
WBC: 4.6 10*3/uL (ref 3.4–10.8)

## 2022-07-26 LAB — COMPREHENSIVE METABOLIC PANEL
ALT: 15 IU/L (ref 0–44)
AST: 20 IU/L (ref 0–40)
Albumin/Globulin Ratio: 2 (ref 1.2–2.2)
Albumin: 4.8 g/dL (ref 4.1–5.1)
Alkaline Phosphatase: 91 IU/L (ref 44–121)
BUN/Creatinine Ratio: 12 (ref 9–20)
BUN: 12 mg/dL (ref 6–24)
Bilirubin Total: 0.3 mg/dL (ref 0.0–1.2)
CO2: 26 mmol/L (ref 20–29)
Calcium: 9.4 mg/dL (ref 8.7–10.2)
Chloride: 98 mmol/L (ref 96–106)
Creatinine, Ser: 1.04 mg/dL (ref 0.76–1.27)
Globulin, Total: 2.4 g/dL (ref 1.5–4.5)
Glucose: 75 mg/dL (ref 70–99)
Potassium: 4.1 mmol/L (ref 3.5–5.2)
Sodium: 139 mmol/L (ref 134–144)
Total Protein: 7.2 g/dL (ref 6.0–8.5)
eGFR: 88 mL/min/{1.73_m2} (ref >59)

## 2022-07-26 LAB — LEVETIRACETAM LEVEL: Levetiracetam Lvl: 9.2 ug/mL — ABNORMAL LOW (ref 10.0–40.0)

## 2022-07-26 LAB — CARBAMAZEPINE LEVEL, TOTAL: Carbamazepine (Tegretol), S: 7.7 ug/mL (ref 4.0–12.0)

## 2022-09-16 ENCOUNTER — Ambulatory Visit (INDEPENDENT_AMBULATORY_CARE_PROVIDER_SITE_OTHER): Payer: Managed Care, Other (non HMO)

## 2022-09-16 ENCOUNTER — Ambulatory Visit
Admission: RE | Admit: 2022-09-16 | Discharge: 2022-09-16 | Disposition: A | Discharge status: Home / Self Care | Payer: Managed Care, Other (non HMO) | Source: Ambulatory Visit | Attending: Nurse Practitioner | Admitting: Nurse Practitioner

## 2022-09-16 ENCOUNTER — Ambulatory Visit: Payer: Managed Care, Other (non HMO)

## 2022-09-16 VITALS — BP 121/74 | HR 62 | Temp 98.1°F | Resp 18

## 2022-09-16 DIAGNOSIS — S82891A Other fracture of right lower leg, initial encounter for closed fracture: Secondary | ICD-10-CM | POA: Diagnosis not present

## 2022-09-16 DIAGNOSIS — M25571 Pain in right ankle and joints of right foot: Secondary | ICD-10-CM | POA: Diagnosis not present

## 2022-09-16 MED ORDER — IBUPROFEN 800 MG PO TABS: 800.0000 mg | ORAL_TABLET | Freq: Three times a day (TID) | ORAL | 0 refills | Status: DC | PRN
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
## 2022-09-16 NOTE — Discharge Instructions (Addendum)
The x-ray shows that you have a fracture of the right ankle.  The x-ray of the foot is negative for fracture or dislocation. I have provided a cam boot for you to utilize until you see orthopedics.  We are also providing crutches for you as it is recommended that you are nonweightbearing until you have seen orthopedics. Take medication as prescribed.  May take over-the-counter Tylenol Arthritis Strength 650 mg tablets 1 to 2 hours after ibuprofen for any breakthrough pain or discomfort. RICE therapy, rest, ice, compression, and elevation. It is recommended that you follow-up with orthopedics within the next 24 to 48 hours.  You can follow-up with [facility] [place] at [phone] or with [facility] at [phone]. Follow-up as needed.

## 2022-09-16 NOTE — ED Triage Notes (Signed)
Tripped over a water hose, hit a cement step.  Pain to right ankle since yesterday

## 2022-09-17 ENCOUNTER — Encounter: Payer: Self-pay | Admitting: Orthopedic Surgery

## 2022-09-17 ENCOUNTER — Ambulatory Visit: Payer: Managed Care, Other (non HMO) | Admitting: Orthopedic Surgery

## 2022-09-17 VITALS — BP 134/94 | HR 87 | Ht 68.0 in | Wt 154.0 lb

## 2022-09-17 DIAGNOSIS — S93401A Sprain of unspecified ligament of right ankle, initial encounter: Secondary | ICD-10-CM

## 2022-09-17 DIAGNOSIS — S93491A Sprain of other ligament of right ankle, initial encounter: Secondary | ICD-10-CM

## 2022-09-17 NOTE — Patient Instructions (Addendum)
OOW 4 WEEKS    Start contrast baths 3 x a day   Start weight bearing in the boot (put the foot on the ground )   Contrast baths are excellent for joints of the body which have been sprained or strained and have swelling. Professional athletes use this to reduce swelling and this helps them to resume activity as soon as possible.  Contrast baths consist of placing a foot or hand into a basin of warm (not hot) water for a short period of time and then taking the extremity and moving it immediately into a solution of ice water. It is kept in the ice cold water for a short period of time, as long as the patient can tolerate, then moved back into the warm solution.  The hand or foot is then removed and put back in the cold solution and kept there as long as possible and then removed and no further soaks are done.   When you first start doing this you will experience the "pins and needle" effect putting your hand in the cold solution. You may only tolerate the ice cold solution for a period of 5 to 30 seconds. You will not like this at first. It will hurt. Please be patient and attempt it as best as you can.   The goal is to place the extremity in the cold water for 5 minutes then place in warm water for 5 min. Repeating until 30 minutes have passed. (that's 3 times in each)  In a few days you will enjoy having your foot or hand in the cold water. As the treatment progresses, you will then find the pins and needle effect taking place after you have had your extremity in the cold solution and then placing it in the warm. It will then be the warm solution that you will dread.  You should notice decreased swelling in your extremity.   After finishing the contrast baths, you need to elevate your extremity for 10 to 15 minutes. For the ankle you will have your toes higher than your nose, and for the hand you will need your hand above the nose as well.   You will need to continue this for 7-10 days unless  otherwise directed.    If you have any questions, please contact our office during office hours.
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
## 2022-10-01 ENCOUNTER — Ambulatory Visit: Payer: Managed Care, Other (non HMO) | Admitting: Orthopedic Surgery

## 2022-10-01 ENCOUNTER — Encounter: Payer: Self-pay | Admitting: Orthopedic Surgery

## 2022-10-01 DIAGNOSIS — S93401A Sprain of unspecified ligament of right ankle, initial encounter: Secondary | ICD-10-CM

## 2022-10-01 DIAGNOSIS — S93491A Sprain of other ligament of right ankle, initial encounter: Secondary | ICD-10-CM | POA: Diagnosis not present

## 2022-10-01 NOTE — Progress Notes (Signed)
Chief Complaint  Patient presents with   Ankle Injury    Right 09/15/22 wants to return to work but states has to stand on concrete floors and had increased pain when he tried to be out of boot yesterday    Encounter Diagnoses  Name Primary?   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

## 2022-10-15 ENCOUNTER — Ambulatory Visit: Payer: Managed Care, Other (non HMO) | Admitting: Orthopedic Surgery

## 2022-10-15 ENCOUNTER — Encounter: Payer: Self-pay | Admitting: Orthopedic Surgery

## 2022-10-15 ENCOUNTER — Telehealth: Payer: Self-pay | Admitting: Orthopedic Surgery

## 2022-10-15 DIAGNOSIS — S93401A Sprain of unspecified ligament of right ankle, initial encounter: Secondary | ICD-10-CM

## 2022-10-15 DIAGNOSIS — S93491A Sprain of other ligament of right ankle, initial encounter: Secondary | ICD-10-CM | POA: Diagnosis not present

## 2022-10-15 NOTE — Telephone Encounter (Signed)
[employer] forms received. To [employer]. 

## 2022-10-15 NOTE — Progress Notes (Signed)
Follow-up visit  Chief Complaint  Patient presents with   Ankle Injury    Right 09/15/22 wants to Return to work Sunday has d/c boot for a few days feels better / has FMLA forms will drop them at desk when he checks out    Encounter Diagnoses  Name Primary?   Sprain of right ankle, unspecified ligament, initial encounter Yes   High ankle sprain of right lower extremity, initial encounter     47  year old male injured his ankle he is finally come out of the boot he did a test run over the weekend seems to be doing better  Examination shows normal gait normal drawer no tenderness no pain no swelling  Warm to walk for about 1 week and as long as he is good he can return to work Sunday

## 2023-01-17 ENCOUNTER — Ambulatory Visit
Admission: EM | Admit: 2023-01-17 | Discharge: 2023-01-17 | Disposition: A | Discharge status: Home / Self Care | Payer: Managed Care, Other (non HMO) | Attending: Family Medicine | Admitting: Family Medicine

## 2023-01-17 DIAGNOSIS — S161XXA Strain of muscle, fascia and tendon at neck level, initial encounter: Secondary | ICD-10-CM | POA: Diagnosis not present

## 2023-01-17 MED ORDER — TIZANIDINE HCL 4 MG PO CAPS: 4.0000 mg | ORAL_CAPSULE | Freq: Three times a day (TID) | ORAL | 0 refills | Status: DC | PRN

## 2023-01-17 MED ORDER — DEXAMETHASONE SODIUM PHOSPHATE 10 MG/ML IJ SOLN
10.0000 mg | Freq: Once | INTRAMUSCULAR | Status: AC
  Administered 2023-01-17: 10 mg via INTRAMUSCULAR
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
## 2023-01-17 NOTE — ED Triage Notes (Signed)
Pt c/o neck pain, x 3 days loss of sleep due to pain. Pt states he was moving items and thinks he may have pulled a muscle he says he was unable to get out of bed yesterday. Pt has used tylenol ibuprofen BC and goody powders with no help

## 2023-04-26 ENCOUNTER — Ambulatory Visit
Admission: RE | Admit: 2023-04-26 | Discharge: 2023-04-26 | Disposition: A | Discharge status: Home / Self Care | Payer: Managed Care, Other (non HMO) | Source: Ambulatory Visit | Attending: Nurse Practitioner | Admitting: Nurse Practitioner

## 2023-04-26 VITALS — BP 139/95 | HR 65 | Temp 98.0°F | Resp 16

## 2023-04-26 DIAGNOSIS — J069 Acute upper respiratory infection, unspecified: Secondary | ICD-10-CM

## 2023-04-26 MED ORDER — FLUTICASONE PROPIONATE 50 MCG/ACT NA SUSP: 2.0000 | Freq: Every day | NASAL | 0 refills | Status: DC

## 2023-04-26 MED ORDER — PROMETHAZINE-DM 6.25-15 MG/5ML PO SYRP: 5.0000 mL | ORAL_SOLUTION | Freq: Four times a day (QID) | ORAL | 0 refills | Status: DC | PRN

## 2023-04-26 MED ORDER — AMOXICILLIN 875 MG PO TABS: 875.0000 mg | ORAL_TABLET | Freq: Two times a day (BID) | ORAL | 0 refills | Status: AC

## 2023-04-26 NOTE — ED Triage Notes (Signed)
Pt states cough for the past 5 days. Has been taking OTC medication with no relief.
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
## 2023-04-26 NOTE — Discharge Instructions (Signed)
Take medication as prescribed. Increase fluids and allow for plenty of rest. May take over-the-counter Tylenol as needed for pain, fever, general discomfort. May use normal saline nasal spray throughout the day to help with nasal congestion and runny nose. For the cough, recommend using a humidifier in your bedroom at nighttime during sleep and sleeping elevated on pillows while symptoms persist. If symptoms do not improve with this treatment, or if symptoms worsen such as fever, worsening cough, wheezing, or other concerns, you may follow-up in this clinic or with your primary care physician for further evaluation. Follow-up as needed.

## 2023-06-03 ENCOUNTER — Encounter: Payer: Self-pay | Admitting: Family Medicine

## 2023-06-14 ENCOUNTER — Ambulatory Visit: Payer: Managed Care, Other (non HMO) | Admitting: Family Medicine

## 2023-06-14 VITALS — BP 152/90 | HR 65 | Ht 68.0 in | Wt 160.0 lb

## 2023-06-14 DIAGNOSIS — I10 Essential (primary) hypertension: Secondary | ICD-10-CM | POA: Diagnosis not present

## 2023-06-14 MED ORDER — LOSARTAN POTASSIUM 50 MG PO TABS: 50.0000 mg | ORAL_TABLET | Freq: Every day | ORAL | 3 refills | Status: AC
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
## 2023-06-15 LAB — COMPLETE METABOLIC PANEL WITH GFR
AG Ratio: 1.8 (calc) (ref 1.0–2.5)
ALT: 16 U/L (ref 9–46)
AST: 17 U/L (ref 10–35)
Albumin: 4.6 g/dL (ref 3.6–5.1)
Alkaline phosphatase (APISO): 85 U/L (ref 35–144)
BUN/Creatinine Ratio: 7 (calc) (ref 6–22)
BUN: 9 mg/dL (ref 7–25)
CO2: 30 mmol/L (ref 20–32)
Calcium: 9.5 mg/dL (ref 8.6–10.3)
Chloride: 103 mmol/L (ref 98–110)
Creat: 1.35 mg/dL — ABNORMAL HIGH (ref 0.70–1.30)
Globulin: 2.6 g/dL (ref 1.9–3.7)
Glucose, Bld: 74 mg/dL (ref 65–99)
Potassium: 4.1 mmol/L (ref 3.5–5.3)
Sodium: 142 mmol/L (ref 135–146)
Total Bilirubin: 0.4 mg/dL (ref 0.2–1.2)
Total Protein: 7.2 g/dL (ref 6.1–8.1)
eGFR: 64 mL/min/{1.73_m2} (ref >=60)

## 2023-06-15 LAB — LIPID PANEL
Cholesterol: 139 mg/dL (ref <200)
HDL: 34 mg/dL — ABNORMAL LOW (ref >=40)
LDL Cholesterol (Calc): 72 mg/dL
Non-HDL Cholesterol (Calc): 105 mg/dL (ref <130)
Total CHOL/HDL Ratio: 4.1 (calc) (ref <5.0)
Triglycerides: 235 mg/dL — ABNORMAL HIGH (ref <150)

## 2023-06-15 LAB — CBC WITH DIFFERENTIAL/PLATELET
Absolute Lymphocytes: 1392 {cells}/uL (ref 850–3900)
Absolute Monocytes: 490 {cells}/uL (ref 200–950)
Basophils Absolute: 28 {cells}/uL (ref 0–200)
Basophils Relative: 0.5 %
Eosinophils Absolute: 72 {cells}/uL (ref 15–500)
Eosinophils Relative: 1.3 %
HCT: 44.7 % (ref 38.5–50.0)
Hemoglobin: 15.7 g/dL (ref 13.2–17.1)
MCH: 34.8 pg — ABNORMAL HIGH (ref 27.0–33.0)
MCHC: 35.1 g/dL (ref 32.0–36.0)
MCV: 99.1 fL (ref 80.0–100.0)
MPV: 10.8 fL (ref 7.5–12.5)
Monocytes Relative: 8.9 %
Neutro Abs: 3520 {cells}/uL (ref 1500–7800)
Neutrophils Relative %: 64 %
Platelets: 198 10*3/uL (ref 140–400)
RBC: 4.51 10*6/uL (ref 4.20–5.80)
RDW: 12.2 % (ref 11.0–15.0)
Total Lymphocyte: 25.3 %
WBC: 5.5 10*3/uL (ref 3.8–10.8)

## 2023-06-27 ENCOUNTER — Other Ambulatory Visit: Payer: Self-pay

## 2023-06-27 ENCOUNTER — Ambulatory Visit
Admission: RE | Admit: 2023-06-27 | Discharge: 2023-06-27 | Disposition: A | Discharge status: Home / Self Care | Payer: Managed Care, Other (non HMO) | Source: Ambulatory Visit | Attending: Internal Medicine | Admitting: Internal Medicine

## 2023-06-27 VITALS — BP 146/105 | HR 82 | Temp 99.1°F | Resp 18

## 2023-06-27 DIAGNOSIS — J329 Chronic sinusitis, unspecified: Secondary | ICD-10-CM | POA: Diagnosis not present

## 2023-06-27 DIAGNOSIS — J40 Bronchitis, not specified as acute or chronic: Secondary | ICD-10-CM

## 2023-06-27 MED ORDER — AMOXICILLIN-POT CLAVULANATE 875-125 MG PO TABS: 1.0000 | ORAL_TABLET | Freq: Two times a day (BID) | ORAL | 0 refills | Status: DC

## 2023-06-27 MED ORDER — ALBUTEROL SULFATE HFA 108 (90 BASE) MCG/ACT IN AERS: 1.0000 | INHALATION_SPRAY | Freq: Four times a day (QID) | RESPIRATORY_TRACT | 0 refills | Status: DC | PRN

## 2023-06-27 MED ORDER — PREDNISONE 20 MG PO TABS: 40.0000 mg | ORAL_TABLET | Freq: Every day | ORAL | 0 refills | Status: AC

## 2023-06-27 NOTE — Discharge Instructions (Signed)
Your evaluation shows you have a bacterial sinus infection plus a viral infection of the upper airways of your lungs. Use the following medicines to help with your symptoms:  - Take antibiotic sent to pharmacy as directed to treat sinus infection. - Take steroid sent to pharmacy as directed starting today. - You may use albuterol inhaler 1 to 2 puffs every 4-6 hours as needed for cough, shortness of breath, and wheezing. - Purchase Mucinex over the counter and take this every 12 hours as needed for nasal congestion and cough.  If you develop any new or worsening symptoms or do not improve in the next 2 to 3 days, please return.  If your symptoms are severe, please go to the emergency room.  Follow-up with your primary care provider for further evaluation and management of your symptoms as well as ongoing wellness visits.  I hope you feel better!

## 2023-06-27 NOTE — ED Triage Notes (Signed)
Patient presents to [facility] for evaluation of 1 week of nasal congestion and cough.  Sometimes productie (clear) sometimes not.  Denies headache, nausea, vomiting, diarrhea, sore throat, fever.
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
## 2023-07-05 ENCOUNTER — Ambulatory Visit: Payer: Managed Care, Other (non HMO) | Admitting: Urology

## 2023-07-05 ENCOUNTER — Encounter: Payer: Self-pay | Admitting: Urology

## 2023-07-05 VITALS — BP 136/87 | HR 69

## 2023-07-05 DIAGNOSIS — Z3009 Encounter for other general counseling and advice on contraception: Secondary | ICD-10-CM

## 2023-07-05 DIAGNOSIS — N529 Male erectile dysfunction, unspecified: Secondary | ICD-10-CM | POA: Diagnosis not present

## 2023-07-05 MED ORDER — DIAZEPAM 10 MG PO TABS: 10.0000 mg | ORAL_TABLET | Freq: Once | ORAL | 0 refills | Status: AC

## 2023-07-05 MED ORDER — TADALAFIL 20 MG PO TABS: 20.0000 mg | ORAL_TABLET | Freq: Every day | ORAL | 5 refills | Status: AC | PRN

## 2023-07-05 NOTE — Patient Instructions (Signed)
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
## 2023-07-11 ENCOUNTER — Other Ambulatory Visit (INDEPENDENT_AMBULATORY_CARE_PROVIDER_SITE_OTHER): Payer: Managed Care, Other (non HMO)

## 2023-07-11 ENCOUNTER — Telehealth: Payer: Self-pay

## 2023-07-11 DIAGNOSIS — R5383 Other fatigue: Secondary | ICD-10-CM | POA: Insufficient documentation

## 2023-07-11 NOTE — Telephone Encounter (Signed)
 Pt came by office with with c/o of fatigue and not feeling well. Pt asked for labs. I entered a CBC and CMET. Is there anything else I should enter? Thank you.

## 2023-07-12 ENCOUNTER — Other Ambulatory Visit: Payer: Self-pay

## 2023-07-12 DIAGNOSIS — E876 Hypokalemia: Secondary | ICD-10-CM

## 2023-07-12 MED ORDER — POTASSIUM CHLORIDE CRYS ER 20 MEQ PO TBCR: 20.0000 meq | EXTENDED_RELEASE_TABLET | Freq: Every day | ORAL | 0 refills | Status: AC

## 2023-07-17 LAB — COMPREHENSIVE METABOLIC PANEL
AG Ratio: 1.7 (calc) (ref 1.0–2.5)
ALT: 16 U/L (ref 9–46)
AST: 16 U/L (ref 10–35)
Albumin: 4.3 g/dL (ref 3.6–5.1)
Alkaline phosphatase (APISO): 84 U/L (ref 35–144)
BUN: 10 mg/dL (ref 7–25)
CO2: 29 mmol/L (ref 20–32)
Calcium: 9.2 mg/dL (ref 8.6–10.3)
Chloride: 101 mmol/L (ref 98–110)
Creat: 1.26 mg/dL (ref 0.70–1.30)
Globulin: 2.6 g/dL (ref 1.9–3.7)
Glucose, Bld: 91 mg/dL (ref 65–99)
Potassium: 3.2 mmol/L — ABNORMAL LOW (ref 3.5–5.3)
Sodium: 141 mmol/L (ref 135–146)
Total Bilirubin: 0.5 mg/dL (ref 0.2–1.2)
Total Protein: 6.9 g/dL (ref 6.1–8.1)

## 2023-07-17 LAB — CBC WITH DIFFERENTIAL/PLATELET
Absolute Lymphocytes: 2373 {cells}/uL (ref 850–3900)
Absolute Monocytes: 567 {cells}/uL (ref 200–950)
Basophils Absolute: 42 {cells}/uL (ref 0–200)
Basophils Relative: 0.6 %
Eosinophils Absolute: 119 {cells}/uL (ref 15–500)
Eosinophils Relative: 1.7 %
HCT: 45.1 % (ref 38.5–50.0)
Hemoglobin: 15.3 g/dL (ref 13.2–17.1)
MCH: 34 pg — ABNORMAL HIGH (ref 27.0–33.0)
MCHC: 33.9 g/dL (ref 32.0–36.0)
MCV: 100.2 fL — ABNORMAL HIGH (ref 80.0–100.0)
MPV: 10.7 fL (ref 7.5–12.5)
Monocytes Relative: 8.1 %
Neutro Abs: 3899 {cells}/uL (ref 1500–7800)
Neutrophils Relative %: 55.7 %
Platelets: 232 10*3/uL (ref 140–400)
RBC: 4.5 10*6/uL (ref 4.20–5.80)
RDW: 12.2 % (ref 11.0–15.0)
Total Lymphocyte: 33.9 %
WBC: 7 10*3/uL (ref 3.8–10.8)

## 2023-07-17 LAB — TEST AUTHORIZATION

## 2023-07-17 LAB — TEST AUTHORIZATION 2

## 2023-07-17 LAB — TSH: TSH: 7.06 m[IU]/L — ABNORMAL HIGH (ref 0.40–4.50)

## 2023-07-17 LAB — T3, FREE: T3, Free: 3.4 pg/mL (ref 2.3–4.2)

## 2023-07-17 LAB — T4, FREE: Free T4: 1.2 ng/dL (ref 0.8–1.8)
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
## 2023-07-23 ENCOUNTER — Encounter: Payer: Self-pay | Admitting: Neurology

## 2023-07-23 ENCOUNTER — Ambulatory Visit: Payer: Managed Care, Other (non HMO) | Admitting: Neurology

## 2023-07-23 VITALS — BP 132/88 | HR 72 | Ht 68.0 in | Wt 157.0 lb

## 2023-07-23 DIAGNOSIS — R569 Unspecified convulsions: Secondary | ICD-10-CM

## 2023-07-23 MED ORDER — LEVETIRACETAM 500 MG PO TABS: 500.0000 mg | ORAL_TABLET | Freq: Two times a day (BID) | ORAL | 3 refills | Status: AC

## 2023-07-23 MED ORDER — CARBAMAZEPINE 200 MG PO TABS: 200.0000 mg | ORAL_TABLET | Freq: Two times a day (BID) | ORAL | 3 refills | Status: AC

## 2023-07-23 NOTE — Patient Instructions (Signed)
 Great to see you today. Continue current seizure medications. Check labs today. Call for seizures. Follow up in 1 year

## 2023-07-24 LAB — VITAMIN D 25 HYDROXY (VIT D DEFICIENCY, FRACTURES): Vit D, 25-Hydroxy: 14.1 ng/mL — ABNORMAL LOW (ref 30.0–100.0)

## 2023-07-24 LAB — BASIC METABOLIC PANEL
BUN/Creatinine Ratio: 7 — ABNORMAL LOW (ref 9–20)
BUN: 8 mg/dL (ref 6–24)
CO2: 27 mmol/L (ref 20–29)
Calcium: 9.4 mg/dL (ref 8.7–10.2)
Chloride: 102 mmol/L (ref 96–106)
Creatinine, Ser: 1.17 mg/dL (ref 0.76–1.27)
Glucose: 87 mg/dL (ref 70–99)
Potassium: 4.6 mmol/L (ref 3.5–5.2)
Sodium: 142 mmol/L (ref 134–144)
eGFR: 76 mL/min/{1.73_m2} (ref >59)

## 2023-07-24 LAB — CARBAMAZEPINE LEVEL, TOTAL: Carbamazepine (Tegretol), S: 9.3 ug/mL (ref 4.0–12.0)

## 2023-07-24 LAB — LEVETIRACETAM LEVEL: Levetiracetam Lvl: 16 ug/mL (ref 10.0–40.0)

## 2023-07-25 ENCOUNTER — Encounter: Payer: Self-pay | Admitting: Neurology

## 2023-09-13 ENCOUNTER — Encounter: Payer: Self-pay | Admitting: Urology

## 2023-09-13 ENCOUNTER — Ambulatory Visit (INDEPENDENT_AMBULATORY_CARE_PROVIDER_SITE_OTHER): Payer: Managed Care, Other (non HMO) | Admitting: Urology

## 2023-09-13 VITALS — BP 130/71 | HR 78

## 2023-09-13 DIAGNOSIS — Z302 Encounter for sterilization: Secondary | ICD-10-CM | POA: Diagnosis not present

## 2023-09-13 DIAGNOSIS — Z3009 Encounter for other general counseling and advice on contraception: Secondary | ICD-10-CM

## 2023-09-13 NOTE — Patient Instructions (Signed)
 Vasectomy Postoperative Instructions  Please bring back a semen analysis in approximately 3 months.  These are scheduled by appointment only.  PLEASE NOTE THE LOCATION BELOW  Your semen analysis  will need to be taken to:  [address] [place], [place] [address] Phone Number: [phone] Hours of scheduling: Monday, Tuesday and Wednesday 10 a.m.- 12 p.m. or 1 p.m.- 3 p.m. [facility] will provide collection instructions at the time of scheduling your appointment   You will be given a sterile specimen cup. Please label the cup with your name, date of birth, date and time of collections.  What to Expect  - slight redness, swelling and scant drainage along the incision  - mild to moderate discomfort  - black and blue (bruising) as the tissue heals  - low grade fever  - scrotal sensitivity and/or tenderness - Edges of the incision may pull apart and heal slowly, sometimes a knot may be present which remains for several months.  This is NORMAL and all part of the healing process. - if stitches are placed, they do not need to be removed - if you have pain or discomfort immediately after the vasectomy, you may use OTC pain medication for relief , ex: tylenol.  After local anesthetic wears off an ice pack will provide additional comfort and can also prevent swelling if used  Activity  - no sexual intercourse for at lease 5 days depending on comfort  - no heavy lifting for 48-72 hours (anything over 5-10 lbs)  Wound Care  - shower only after 24 hours  - no tub baths, hot tub, or pools for at least 7 days  - ice packs for 48 hours: 30 minutes on and 30 minutes off  Problem to Report  - generalized redness  - increased pain and swelling  - fever greater than 101 F  - significant drainage or bleeding from the wound  TO DO - Ejaculations help to clear the passage of sperm, but you must use another from of birth control until you are told you may discontinue its use!! - You will be given a  specimen cup to bring back a semen sample in 3 months to check and see if its clear of sperm.  Only after the semen is sent for analysis and is reported back as clear should you use this as your primary form of birth control!

## 2023-09-13 NOTE — Progress Notes (Signed)
 09/13/23  CC: desires sterilization   HPI: Mr Bisceglia is a 48yo here for vasectomy Blood pressure 130/71, pulse 78. NED. A&Ox3.   No respiratory distress   Abd soft, NT, ND Normal external genitalia with patent urethral meatus  A timeout was performed.  Patient's identity and consent was confirmed.  All questions were answered.   Bilateral Vasectomy Procedure  Pre-Procedure: - Patient's scrotum was prepped and draped for vasectomy. - The vas was palpated through the scrotal skin on the left. - 1% Xylocaine was injected into the skin and surrounding tissue for placement  - In a similar manner, the vas on the right was identified, anesthetized, and stabilized.  Procedure: - A sharp hemostat was used to make a small stab incision in the skin overlying the vas - The left vas was isolated and brought up through the incision exposing that structure. - Bleeding points were cauterized as they occurred. - The vas was free from the surrounding structures and brought to the view. - A segment was positioned for placement with a hemostat. - A second hemostat was placed and a small segment between the two hemostats and was removed for inspection. - Each end of the transected vas lumen was fulgurated/ obliterated using needlepoint electrocautery -A fascial interposition was performed on testicular end of the vas using #3-0 chromic suture -The same procedure was performed on the right. - A single suture of #3-0 chromic catgut was used to close each lateral scrotal skin incision - A dressing was applied.  Post-Procedure: - Patient was instructed in care of the operative area - A specimen is to be delivered in 12 weeks   -Another form of contraception is to be used until post vasectomy semen analysis  Wilkie Aye, MD

## 2024-07-22 ENCOUNTER — Ambulatory Visit: Payer: Managed Care, Other (non HMO) | Admitting: Neurology
